# Patient Record
Sex: Male | Born: 1970 | Race: White | Hispanic: No | Marital: Married | Smoking: Never smoker
Health system: Southern US, Community
[De-identification: ages and names within clinical notes are randomized; demographics above are authoritative.]

## PROBLEM LIST (undated history)

## (undated) DIAGNOSIS — G4730 Sleep apnea, unspecified: Secondary | ICD-10-CM

## (undated) DIAGNOSIS — F419 Anxiety disorder, unspecified: Secondary | ICD-10-CM

## (undated) HISTORY — PX: APPENDECTOMY: SHX54

---

## 2008-01-02 ENCOUNTER — Emergency Department: Payer: Self-pay | Admitting: Emergency Medicine

## 2008-01-29 ENCOUNTER — Ambulatory Visit: Payer: Self-pay | Admitting: Unknown Physician Specialty

## 2008-02-10 ENCOUNTER — Ambulatory Visit: Payer: Self-pay | Admitting: Unknown Physician Specialty

## 2013-02-18 ENCOUNTER — Ambulatory Visit: Payer: Self-pay | Admitting: Family Medicine

## 2013-02-18 ENCOUNTER — Inpatient Hospital Stay: Payer: Self-pay | Admitting: Surgery

## 2013-02-18 LAB — URINALYSIS, COMPLETE
Bacteria: NONE SEEN
Bilirubin,UR: NEGATIVE
Glucose,UR: NEGATIVE mg/dL (ref 0–75)
Leukocyte Esterase: NEGATIVE
Nitrite: NEGATIVE
Ph: 5 (ref 4.5–8.0)
Squamous Epithelial: NONE SEEN

## 2013-02-18 LAB — COMPREHENSIVE METABOLIC PANEL
Albumin: 2.9 g/dL — ABNORMAL LOW (ref 3.4–5.0)
Alkaline Phosphatase: 168 U/L — ABNORMAL HIGH (ref 50–136)
Anion Gap: 6 — ABNORMAL LOW (ref 7–16)
Bilirubin,Total: 0.8 mg/dL (ref 0.2–1.0)
Calcium, Total: 8.4 mg/dL — ABNORMAL LOW (ref 8.5–10.1)
Creatinine: 1.09 mg/dL (ref 0.60–1.30)
EGFR (Non-African Amer.): >60
Glucose: 118 mg/dL — ABNORMAL HIGH (ref 65–99)
Osmolality: 265 (ref 275–301)
Potassium: 3.6 mmol/L (ref 3.5–5.1)
SGOT(AST): 53 U/L — ABNORMAL HIGH (ref 15–37)
Sodium: 132 mmol/L — ABNORMAL LOW (ref 136–145)
Total Protein: 7.9 g/dL (ref 6.4–8.2)

## 2013-02-18 LAB — CBC
MCHC: 33.3 g/dL (ref 32.0–36.0)
MCV: 92 fL (ref 80–100)

## 2013-02-19 LAB — CBC WITH DIFFERENTIAL/PLATELET
Basophil #: 0 10*3/uL (ref 0.0–0.1)
Basophil %: 0.1 %
Eosinophil %: 0 %
HCT: 34.7 % — ABNORMAL LOW (ref 40.0–52.0)
Lymphocyte #: 0.8 10*3/uL — ABNORMAL LOW (ref 1.0–3.6)
Lymphocyte %: 4.9 %
MCH: 30.5 pg (ref 26.0–34.0)
MCHC: 32.9 g/dL (ref 32.0–36.0)
Monocyte #: 0.8 x10 3/mm (ref 0.2–1.0)
Monocyte %: 5.1 %
Neutrophil #: 13.8 10*3/uL — ABNORMAL HIGH (ref 1.4–6.5)
Platelet: 300 10*3/uL (ref 150–440)
RBC: 3.74 10*6/uL — ABNORMAL LOW (ref 4.40–5.90)
WBC: 15.4 10*3/uL — ABNORMAL HIGH (ref 3.8–10.6)

## 2013-02-19 LAB — BASIC METABOLIC PANEL
Anion Gap: 4 — ABNORMAL LOW (ref 7–16)
Calcium, Total: 7.9 mg/dL — ABNORMAL LOW (ref 8.5–10.1)
Co2: 29 mmol/L (ref 21–32)
Creatinine: 1.14 mg/dL (ref 0.60–1.30)
EGFR (African American): >60
EGFR (Non-African Amer.): >60
Glucose: 197 mg/dL — ABNORMAL HIGH (ref 65–99)
Osmolality: 272 (ref 275–301)
Sodium: 133 mmol/L — ABNORMAL LOW (ref 136–145)

## 2013-02-20 LAB — CBC WITH DIFFERENTIAL/PLATELET
Basophil #: 0 10*3/uL (ref 0.0–0.1)
Basophil %: 0.3 %
Eosinophil #: 0.3 10*3/uL (ref 0.0–0.7)
HCT: 32.9 % — ABNORMAL LOW (ref 40.0–52.0)
HGB: 11 g/dL — ABNORMAL LOW (ref 13.0–18.0)
Lymphocyte #: 1.3 10*3/uL (ref 1.0–3.6)
Lymphocyte %: 8.3 %
MCV: 93 fL (ref 80–100)
Monocyte %: 7.3 %
Neutrophil #: 13.3 10*3/uL — ABNORMAL HIGH (ref 1.4–6.5)
Neutrophil %: 82.5 %
RBC: 3.56 10*6/uL — ABNORMAL LOW (ref 4.40–5.90)
WBC: 16.1 10*3/uL — ABNORMAL HIGH (ref 3.8–10.6)

## 2013-02-20 LAB — BASIC METABOLIC PANEL
Anion Gap: 4 — ABNORMAL LOW (ref 7–16)
Calcium, Total: 7.9 mg/dL — ABNORMAL LOW (ref 8.5–10.1)
Chloride: 101 mmol/L (ref 98–107)
EGFR (African American): >60
EGFR (Non-African Amer.): >60
Glucose: 136 mg/dL — ABNORMAL HIGH (ref 65–99)
Osmolality: 272 (ref 275–301)
Potassium: 4.5 mmol/L (ref 3.5–5.1)
Sodium: 135 mmol/L — ABNORMAL LOW (ref 136–145)

## 2013-02-21 LAB — BASIC METABOLIC PANEL
Calcium, Total: 8 mg/dL — ABNORMAL LOW (ref 8.5–10.1)
Chloride: 99 mmol/L (ref 98–107)
Creatinine: 0.86 mg/dL (ref 0.60–1.30)
EGFR (Non-African Amer.): >60
Glucose: 136 mg/dL — ABNORMAL HIGH (ref 65–99)
Potassium: 4.3 mmol/L (ref 3.5–5.1)
Sodium: 134 mmol/L — ABNORMAL LOW (ref 136–145)

## 2013-02-21 LAB — CBC WITH DIFFERENTIAL/PLATELET
Basophil #: 0.1 10*3/uL (ref 0.0–0.1)
Eosinophil #: 0.2 10*3/uL (ref 0.0–0.7)
HGB: 10.6 g/dL — ABNORMAL LOW (ref 13.0–18.0)
Lymphocyte %: 6.7 %
MCHC: 32.5 g/dL (ref 32.0–36.0)
Monocyte %: 7 %
RBC: 3.53 10*6/uL — ABNORMAL LOW (ref 4.40–5.90)
WBC: 15.4 10*3/uL — ABNORMAL HIGH (ref 3.8–10.6)

## 2013-02-23 LAB — WOUND CULTURE

## 2013-02-24 LAB — CBC WITH DIFFERENTIAL/PLATELET
Basophil %: 0.4 %
Eosinophil #: 0.4 10*3/uL (ref 0.0–0.7)
Eosinophil %: 2.6 %
HCT: 37.6 % — ABNORMAL LOW (ref 40.0–52.0)
HGB: 12.8 g/dL — ABNORMAL LOW (ref 13.0–18.0)
Lymphocyte #: 1.5 10*3/uL (ref 1.0–3.6)
Lymphocyte %: 9.9 %
MCHC: 34 g/dL (ref 32.0–36.0)
Monocyte #: 0.8 x10 3/mm (ref 0.2–1.0)
Monocyte %: 5.4 %
Neutrophil #: 12 10*3/uL — ABNORMAL HIGH (ref 1.4–6.5)
Neutrophil %: 81.7 %
Platelet: 450 10*3/uL — ABNORMAL HIGH (ref 150–440)
RBC: 4.17 10*6/uL — ABNORMAL LOW (ref 4.40–5.90)
RDW: 14.7 % — ABNORMAL HIGH (ref 11.5–14.5)
WBC: 14.7 10*3/uL — ABNORMAL HIGH (ref 3.8–10.6)

## 2013-02-24 LAB — COMPREHENSIVE METABOLIC PANEL
Albumin: 2.4 g/dL — ABNORMAL LOW (ref 3.4–5.0)
Alkaline Phosphatase: 249 U/L — ABNORMAL HIGH (ref 50–136)
Anion Gap: 5 — ABNORMAL LOW (ref 7–16)
Bilirubin,Total: 0.5 mg/dL (ref 0.2–1.0)
Chloride: 99 mmol/L (ref 98–107)
Co2: 31 mmol/L (ref 21–32)
Glucose: 106 mg/dL — ABNORMAL HIGH (ref 65–99)
SGOT(AST): 107 U/L — ABNORMAL HIGH (ref 15–37)
SGPT (ALT): 135 U/L — ABNORMAL HIGH (ref 12–78)
Sodium: 135 mmol/L — ABNORMAL LOW (ref 136–145)
Total Protein: 7.6 g/dL (ref 6.4–8.2)

## 2013-02-24 LAB — PROTIME-INR: Prothrombin Time: 14.5 secs (ref 11.5–14.7)

## 2013-02-24 LAB — APTT: Activated PTT: 28 secs (ref 23.6–35.9)

## 2013-02-25 LAB — PROTIME-INR
INR: 1.1
Prothrombin Time: 14 secs (ref 11.5–14.7)

## 2013-02-25 LAB — APTT: Activated PTT: 30.3 secs (ref 23.6–35.9)

## 2013-03-01 LAB — CBC WITH DIFFERENTIAL/PLATELET
Basophil %: 0.8 %
Eosinophil #: 0.4 10*3/uL (ref 0.0–0.7)
Eosinophil %: 2.7 %
HCT: 39.4 % — ABNORMAL LOW (ref 40.0–52.0)
HGB: 13 g/dL (ref 13.0–18.0)
Lymphocyte #: 1.8 10*3/uL (ref 1.0–3.6)
Lymphocyte %: 13.4 %
MCH: 29.8 pg (ref 26.0–34.0)
Monocyte #: 0.7 x10 3/mm (ref 0.2–1.0)
Monocyte %: 5.1 %
Neutrophil #: 10.4 10*3/uL — ABNORMAL HIGH (ref 1.4–6.5)
Neutrophil %: 78 %
Platelet: 408 10*3/uL (ref 150–440)
RBC: 4.37 10*6/uL — ABNORMAL LOW (ref 4.40–5.90)
WBC: 13.4 10*3/uL — ABNORMAL HIGH (ref 3.8–10.6)

## 2013-03-02 LAB — CBC WITH DIFFERENTIAL/PLATELET
Basophil %: 0.5 %
Eosinophil #: 0.4 10*3/uL (ref 0.0–0.7)
HCT: 42 % (ref 40.0–52.0)
HGB: 14 g/dL (ref 13.0–18.0)
Lymphocyte %: 15.8 %
MCH: 30.2 pg (ref 26.0–34.0)
Monocyte #: 0.7 x10 3/mm (ref 0.2–1.0)
Monocyte %: 5.5 %
Neutrophil #: 9.6 10*3/uL — ABNORMAL HIGH (ref 1.4–6.5)
Neutrophil %: 75.1 %
RBC: 4.62 10*6/uL (ref 4.40–5.90)

## 2013-03-02 LAB — COMPREHENSIVE METABOLIC PANEL
Alkaline Phosphatase: 157 U/L — ABNORMAL HIGH (ref 50–136)
Anion Gap: 4 — ABNORMAL LOW (ref 7–16)
Calcium, Total: 8.9 mg/dL (ref 8.5–10.1)
Glucose: 133 mg/dL — ABNORMAL HIGH (ref 65–99)
Osmolality: 276 (ref 275–301)
Potassium: 4.2 mmol/L (ref 3.5–5.1)
SGOT(AST): 26 U/L (ref 15–37)
SGPT (ALT): 74 U/L (ref 12–78)

## 2013-03-02 LAB — BODY FLUID CULTURE

## 2013-03-03 LAB — CBC WITH DIFFERENTIAL/PLATELET
Basophil #: 0.1 10*3/uL (ref 0.0–0.1)
Basophil %: 0.5 %
Eosinophil #: 0.5 10*3/uL (ref 0.0–0.7)
Eosinophil %: 3.2 %
HCT: 40.1 % (ref 40.0–52.0)
HGB: 13.3 g/dL (ref 13.0–18.0)
Lymphocyte #: 2.5 10*3/uL (ref 1.0–3.6)
Lymphocyte %: 17.1 %
MCH: 30 pg (ref 26.0–34.0)
MCHC: 33.3 g/dL (ref 32.0–36.0)
MCV: 90 fL (ref 80–100)
Monocyte #: 0.9 x10 3/mm (ref 0.2–1.0)
Neutrophil #: 10.7 10*3/uL — ABNORMAL HIGH (ref 1.4–6.5)
Neutrophil %: 73 %
RBC: 4.44 10*6/uL (ref 4.40–5.90)
RDW: 14.2 % (ref 11.5–14.5)
WBC: 14.7 10*3/uL — ABNORMAL HIGH (ref 3.8–10.6)

## 2013-03-03 LAB — CLOSTRIDIUM DIFFICILE BY PCR

## 2013-03-04 LAB — CBC WITH DIFFERENTIAL/PLATELET
Basophil #: 0.1 10*3/uL (ref 0.0–0.1)
Eosinophil #: 0.5 10*3/uL (ref 0.0–0.7)
Eosinophil %: 4.5 %
HCT: 39.9 % — ABNORMAL LOW (ref 40.0–52.0)
HGB: 13.3 g/dL (ref 13.0–18.0)
MCH: 30.2 pg (ref 26.0–34.0)
MCHC: 33.4 g/dL (ref 32.0–36.0)
MCV: 90 fL (ref 80–100)
Neutrophil #: 8 10*3/uL — ABNORMAL HIGH (ref 1.4–6.5)
Platelet: 363 10*3/uL (ref 150–440)
RBC: 4.41 10*6/uL (ref 4.40–5.90)
RDW: 14.5 % (ref 11.5–14.5)
WBC: 11.5 10*3/uL — ABNORMAL HIGH (ref 3.8–10.6)

## 2013-03-05 LAB — CBC WITH DIFFERENTIAL/PLATELET
Basophil #: 0.2 10*3/uL — ABNORMAL HIGH (ref 0.0–0.1)
Basophil %: 1.8 %
Eosinophil %: 3.7 %
HCT: 41.5 % (ref 40.0–52.0)
Lymphocyte %: 17.5 %
MCHC: 33.1 g/dL (ref 32.0–36.0)
MCV: 91 fL (ref 80–100)
Monocyte %: 5.5 %
Neutrophil #: 9.5 10*3/uL — ABNORMAL HIGH (ref 1.4–6.5)
Neutrophil %: 71.5 %

## 2013-03-05 LAB — PROTIME-INR: INR: 1

## 2013-03-05 LAB — APTT: Activated PTT: 32.3 secs (ref 23.6–35.9)

## 2013-03-05 LAB — CREATININE, SERUM: Creatinine: 1.32 mg/dL — ABNORMAL HIGH (ref 0.60–1.30)

## 2013-03-06 LAB — CBC WITH DIFFERENTIAL/PLATELET
Basophil #: 0.1 10*3/uL (ref 0.0–0.1)
Eosinophil #: 0.5 10*3/uL (ref 0.0–0.7)
Eosinophil %: 4.2 %
Lymphocyte %: 19.4 %
MCH: 30.5 pg (ref 26.0–34.0)
Monocyte #: 0.8 x10 3/mm (ref 0.2–1.0)
Platelet: 335 10*3/uL (ref 150–440)
RDW: 14.5 % (ref 11.5–14.5)

## 2013-03-10 ENCOUNTER — Ambulatory Visit: Payer: Self-pay | Admitting: Surgery

## 2013-03-23 ENCOUNTER — Other Ambulatory Visit: Payer: Self-pay | Admitting: Specialist

## 2013-03-23 LAB — CBC WITH DIFFERENTIAL/PLATELET
Eosinophil #: 0.4 10*3/uL (ref 0.0–0.7)
Eosinophil %: 4.9 %
HCT: 36.3 % — ABNORMAL LOW (ref 40.0–52.0)
HGB: 12.2 g/dL — ABNORMAL LOW (ref 13.0–18.0)
Lymphocyte %: 15.9 %
MCV: 90 fL (ref 80–100)
Monocyte #: 0.7 x10 3/mm (ref 0.2–1.0)
Neutrophil #: 5.9 10*3/uL (ref 1.4–6.5)
Neutrophil %: 70.6 %
Platelet: 205 10*3/uL (ref 150–440)
RBC: 4.05 10*6/uL — ABNORMAL LOW (ref 4.40–5.90)
WBC: 8.4 10*3/uL (ref 3.8–10.6)

## 2013-03-23 LAB — COMPREHENSIVE METABOLIC PANEL
Albumin: 3 g/dL — ABNORMAL LOW (ref 3.4–5.0)
BUN: 13 mg/dL (ref 7–18)
Bilirubin,Total: 0.4 mg/dL (ref 0.2–1.0)
Calcium, Total: 8.6 mg/dL (ref 8.5–10.1)
Chloride: 106 mmol/L (ref 98–107)
Co2: 28 mmol/L (ref 21–32)
Creatinine: 0.87 mg/dL (ref 0.60–1.30)
EGFR (Non-African Amer.): >60
Glucose: 109 mg/dL — ABNORMAL HIGH (ref 65–99)
Potassium: 3.7 mmol/L (ref 3.5–5.1)
SGOT(AST): 26 U/L (ref 15–37)
Total Protein: 6.9 g/dL (ref 6.4–8.2)

## 2013-03-24 ENCOUNTER — Ambulatory Visit: Payer: Self-pay | Admitting: Specialist

## 2013-07-24 ENCOUNTER — Ambulatory Visit: Payer: Self-pay | Admitting: Physician Assistant

## 2013-12-25 IMAGING — CT CT ABD-PELV W/ CM
1 of 2 series · 15 of 32 positions shown, 19 images · non-contrast
Comparison: none

REASON FOR EXAM: (1) pelvic abscess; (2) same
COMMENTS:

[Series 2: soft tissue · axial · 0.84mm/px · z∈[-1100,-586]mm · 15 of 187 slices shown, 19 images]
[im 8/187  soft-tissue]
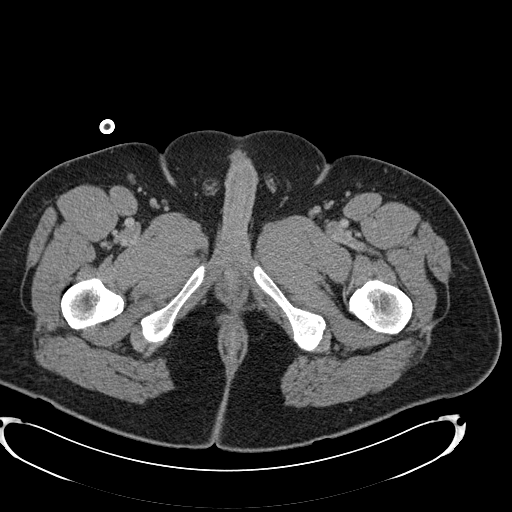
[im 8/187  bone]
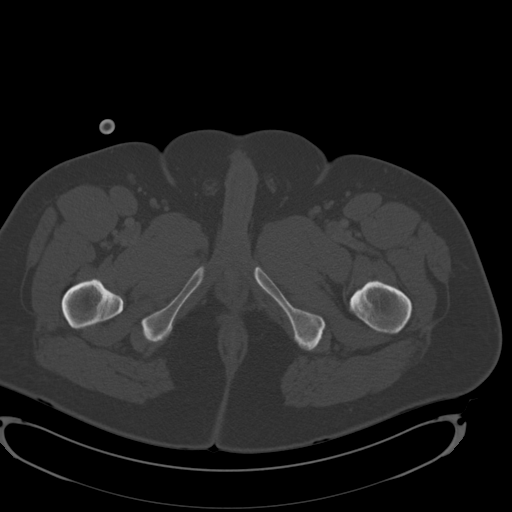
[im 23/187  soft-tissue]
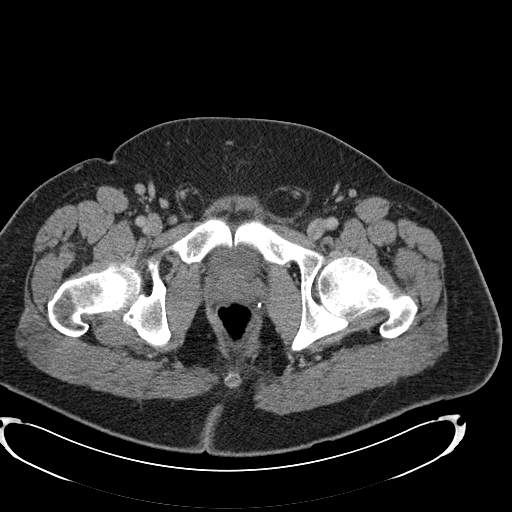
[im 38/187  soft-tissue]
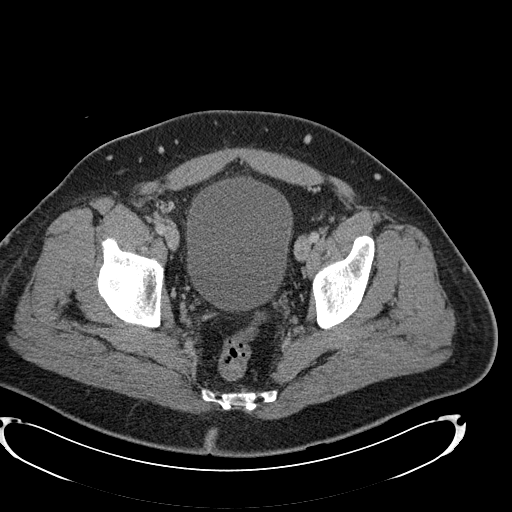
[im 53/187  soft-tissue]
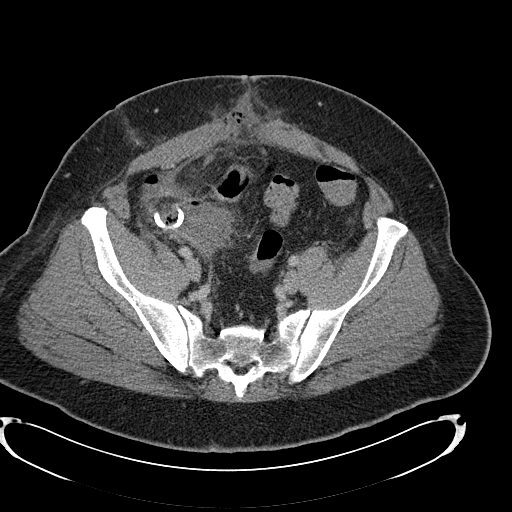
[im 67/187  soft-tissue]
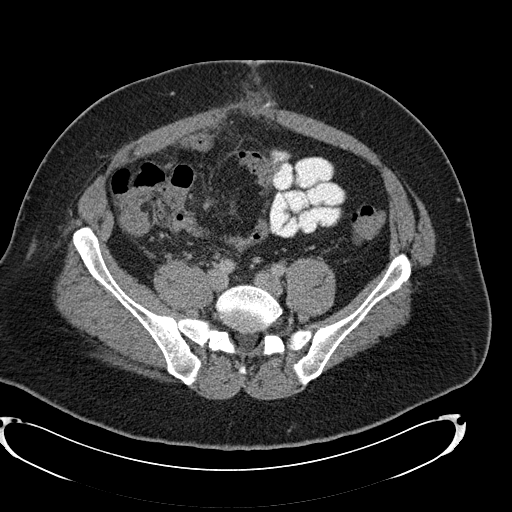
[im 82/187  soft-tissue]
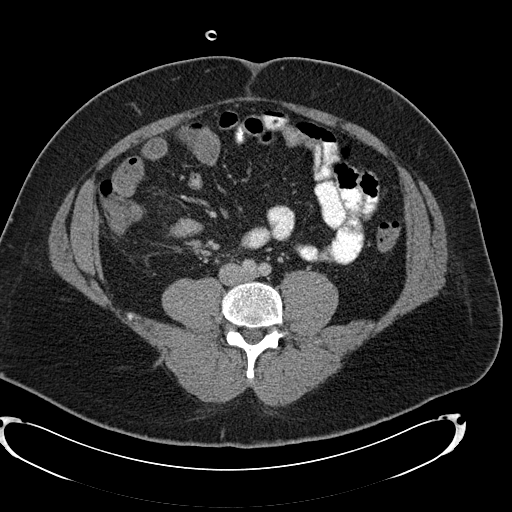
[im 97/187  soft-tissue]
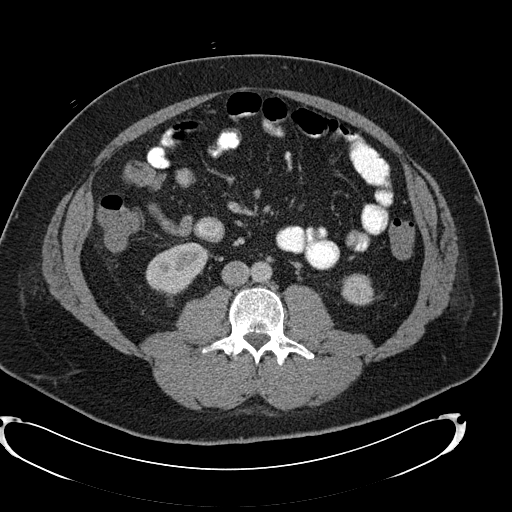
[im 105/187  soft-tissue]
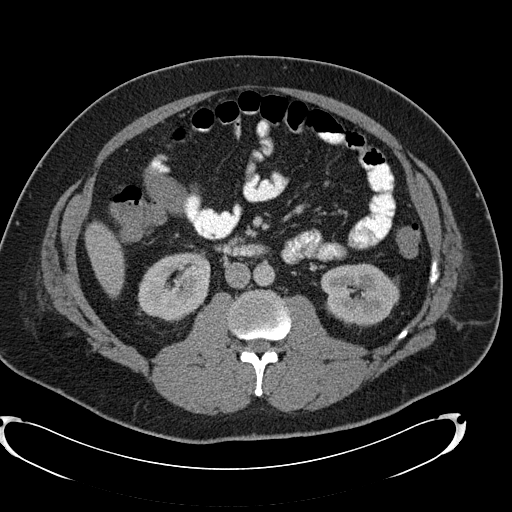
[im 120/187  soft-tissue]
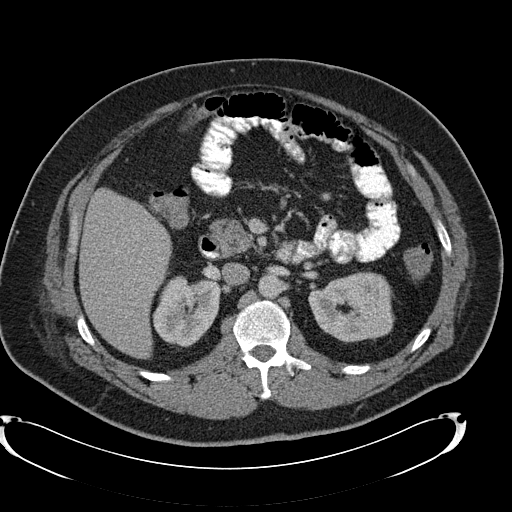
[im 120/187  bone]
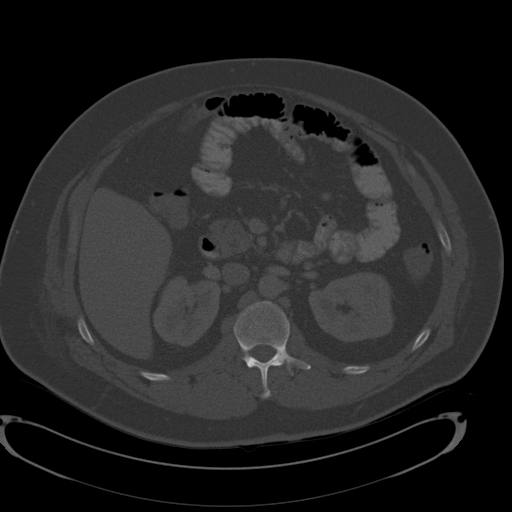
[im 134/187  soft-tissue]
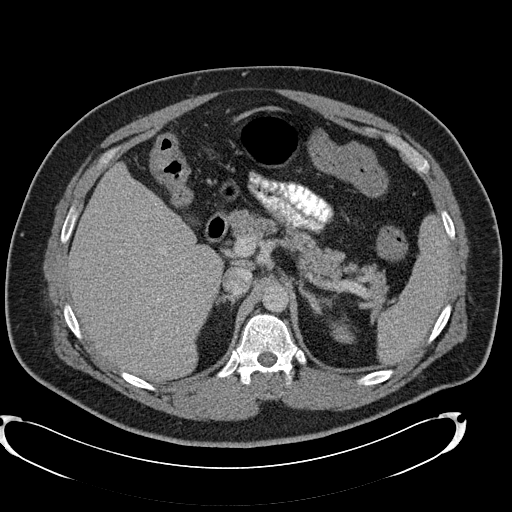
[im 149/187  soft-tissue]
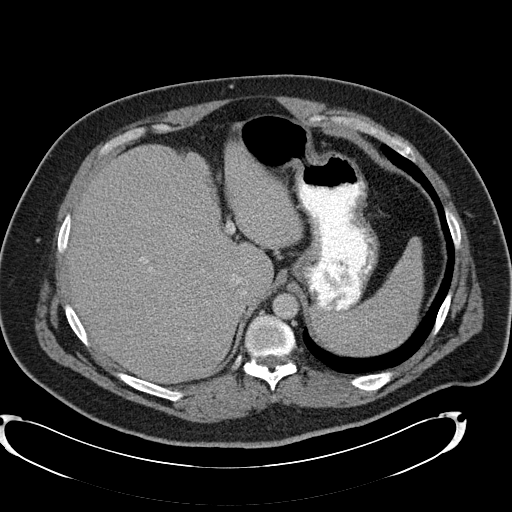
[im 157/187  lung]
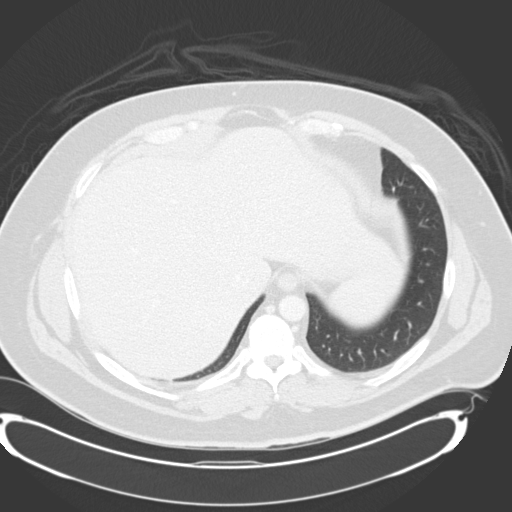
[im 164/187  soft-tissue]
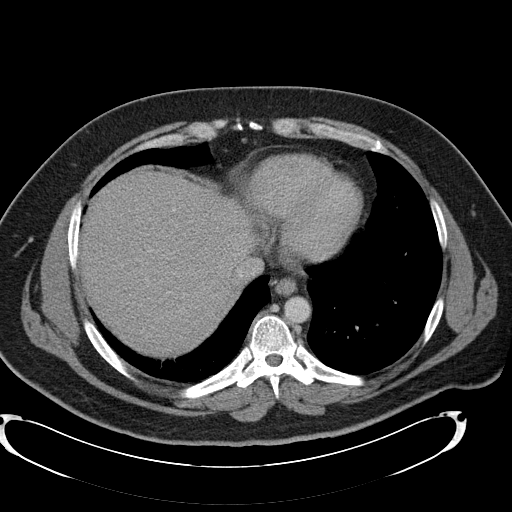
[im 164/187  lung]
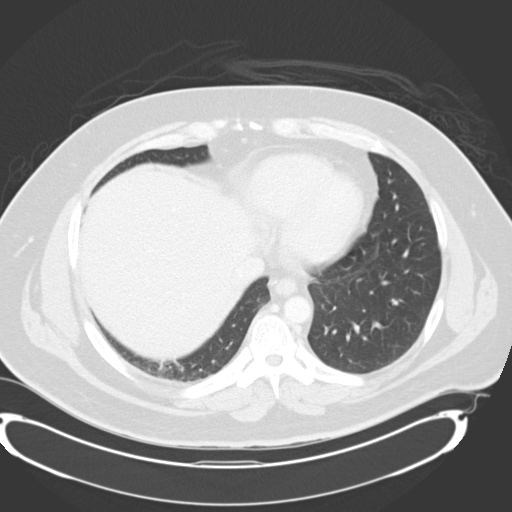
[im 172/187  lung]
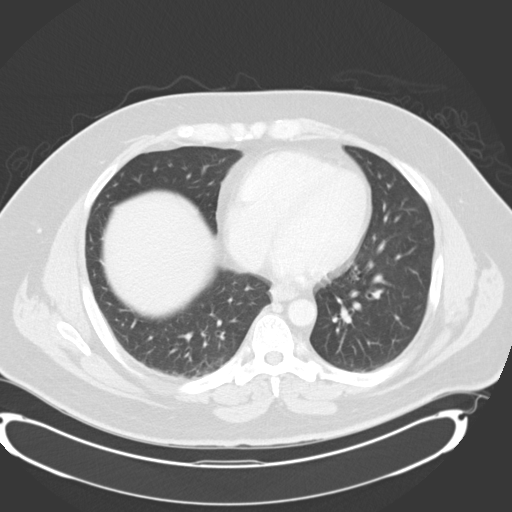
[im 179/187  soft-tissue]
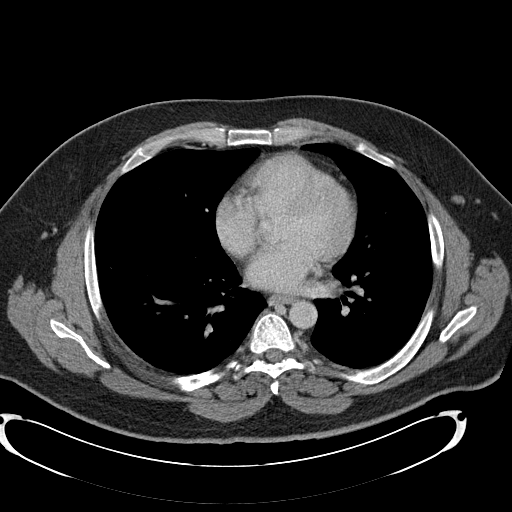
[im 179/187  lung]
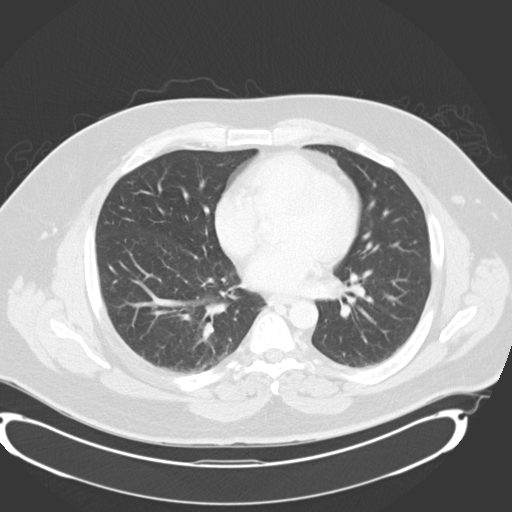

[15 of 32 positions shown; findings below may reference images not displayed]

PROCEDURE:     CT  - CT ABDOMEN / PELVIS  W  - March 02, 2013  [DATE]

RESULT:     CT of the abdomen and pelvis is performed utilizing 100 mL of
Zsovue-2Q0 iodinated intravenous contrast and oral contrast with images
reconstructed at 3 mm slice thickness in the axial plane. Comparison is made
to the previous exam dated 24 February, 2013.

There is right lower quadrant percutaneous drainage catheter present. The
large abscess demonstrated previously in the right lower quadrant is smaller
in size mass showing an anterior-posterior dimension of approximately
cm with a transverse dimension of 4.8 cm. Catheter appears to be along the
right lateral aspect of the abscess. Is the catheter still draining?
Postoperative changes are seen from midline incision in the lower midabdomen
infraumbilical region with some air bubbles within the subcutaneous tissues
but without clear-cut abscess. No ascites is evident. Phlegmonous changes
are seen in the right lower quadrant. The kidneys show no obstruction or
nephrolithiasis. There is no discrete mass evident area no gallstones are
evident. The liver, spleen, pancreas, adrenal glands and abdominal aorta
appear unremarkable. The bowel is otherwise unchanged. There is a moderate
amount of urine in the urinary bladder. Bony structures appear unremarkable.
The lung bases show grossly normal aeration. No pleural or pericardial
effusion is present.
IMPRESSION: 1. Decreasing size of the right lower quadrant abscess with percutaneous
drainage catheter present as described.

[REDACTED]

## 2015-02-18 NOTE — Consult Note (Signed)
Impression:    44yo male w/o sig PMHx admitted with cecal abscess from ruptured appendix, s/p surgical drainage who developed drug reaction.    His drug reaction appears to be restricted to a rash.  He feels that it is improving since changing antibiotics.  Unclear if his reaction was to clinda or to cefoxitan.  Would consider him possibly allergic to either one.  He is tolerating zosyn, which would mean that were he allergic to cephalosporins, he could still tolerate B-lactams.   Zosyn will provide broad abd coverage.  Will continue this for now.   His WBC is still up, but trending down.  Will recheck in am.  His LFTs were up at last check.  Will recheck tomorrow.   Will need to see if surgery feels the need to repeat the CT scan or if they will pull the drains when they no longer have any drainage.  Could eventually change to po.  While there is some increasing E. coli resistance to amp/sulbactam, Augmentin may be a reasonable choice.  Cipro/metronidazole would be another oral option. 6)    Pseudomonas medocina is not a typical human pathogen.  It has been reported in some immunosuppressed patients.  One case report of a healthy male patient with P. medocina sepsis was related to feeding his pet bird water from his mouth in [place].  The water was subsequently found to harbor the organism.  there is no need to specifically targe the P. medocina or the yeast.  Electronic Signatures: Mystique Bjelland MPH, Rosalyn GessMichael E (MD) (Signed on 04-May-14 08:40)  Authored   Last Updated: 04-May-14 08:58 by Elah Avellino MPH, Rosalyn GessMichael E (MD)

## 2015-02-18 NOTE — Consult Note (Signed)
PATIENT NAME:  Jesse Pearson, Jesse Pearson MR#:  [id number] DATE OF BIRTH:  Dec 30, 1970  DATE OF CONSULTATION:  02/18/2013  REFERRING PHYSICIAN:  Rodena Goldmann, III, MD  CONSULTING PHYSICIAN:  Tama High III, MD  REASON FOR CONSULT: Respiratory failure, sepsis in a patient with abdominal abscess, likely ruptured appendiceal abscess.   HISTORY OF PRESENT ILLNESS: The patient is a 44 year old male who presented to acute care earlier this week with abdominal pain, was initially treated with antibiotics but showed progression. He was found to have evidence of tachycardia, fever and worsening abdominal discomfort. CT scan showed evidence of right lower quadrant abscess which was worrisome for a ruptured appendix. He has undergone operative draining of the abscess through surgery. Postoperatively, he has remained on the ventilator, hypotensive, requiring Diprivan for sedation. We were asked to see him for further assessment and assistance in management.   PAST MEDICAL HISTORY: Mild hypertension, per old records.   ALLERGIES: No known drug allergies.   MEDICATIONS: 1.  D5 normal saline +20 mEq of K at 150 mL/hr.  2.  Diprivan drip.  3.  Cefoxitin 2 grams IV q. 8 hours.  4.  Lovenox 40 mg subcutaneous daily.  5.  Dilaudid 1 to 2 mg IV every 2 hours p.r.n. pain.  6.  Pantoprazole 40 mg IV b.i.d.  7.  Zofran 4 mg IV q. 4 hours p.r.n.   SOCIAL HISTORY: Obtained from old records. History of chewing tobacco. No alcohol.   FAMILY HISTORY: Lung cancer.   REVIEW OF SYSTEMS: Please see history of present illness. The patient is unable to provide further information.  All information was gathered from the chart and review of old records.    PHYSICAL EXAMINATION:  VITAL SIGNS: Initial temperature 98.7 with pulse of 103. Current pulse is 81 with blood pressure 101/55.  GENERAL: Obese male, intubated, sedated.  HEENT: Eyes: Pupils are minimally reactive. He blinks to touch of the lids. Ears, nose and throat:  Endotracheal tube is in place. The oropharynx is dry without lesions.  NECK: Trachea in the midline. No thyromegaly.  CARDIOVASCULAR: Regular rate and rhythm without murmurs, gallops or rubs. Carotid and radial pulses are 1+.  LUNGS: Clear bilaterally without the patient breathing over the vent. Current settings are rate of 14, tidal volume 600, FiO2 40%.  ABDOMEN: Soft and distended with absent bowel sounds.  JP drain in the right lower quadrant.  SKIN: Postoperative changes as above without other significant rashes or nodules.  LYMPH NODES: No cervical or supraclavicular nodes.  MUSCULOSKELETAL: No clubbing, cyanosis, edema.  NEUROLOGIC: The patient is sedated as noted above.   LABORATORY AND RADIOLOGICAL DATA: ABG at current vent settings showed pH 7.3 with pCO2 56, pO2 85. Urinalysis quite concentrated at greater than 1.06, otherwise unremarkable. BUN 10, creatinine 1.09, sodium 132. Alkaline phosphatase 168 with ALT 94, AST 53, albumin 2.9, glucose 118. White count 20.7, hematocrit 38.   IMPRESSION AND PLAN:  Abdominal abscess with sepsis, likely secondary to ruptured appendix:  Continue sedation and management of ventilator for now. We will discuss with Surgery regarding further intervention. Antibiotic choices through Surgery are certainly appropriate in this patient who has had minimal contact with health care and should cover the necessary organisms. I agree with aggressive hydration as he appears intravascularly depleted. We will increase the vent rate up to 16 and repeat his ABG. Given his body habitus, he may have some elements of sleep apnea; we will have to watch this closely as he is  weaned off the vent. Continue monitoring CBC, Met-B as you are doing and watch for worsening of renal function secondary to sepsis, which may require alteration of his antibiotic dosing. We will follow with you during this hospitalization.  ____________________________ Adin Hector,  MD bjk:cb D: 02/18/2013 20:08:18 ET T: 02/18/2013 20:26:34 ET JOB#: [id number] cc: Adin Hector, MD, <Dictator> Ramonita Lab MD ELECTRONICALLY SIGNED 02/24/2013 6:47

## 2015-02-18 NOTE — Consult Note (Signed)
Brief Consult Note: Diagnosis: post op abdominal abscess.   Patient was seen by consultant.   Consult note dictated.   Orders entered.  Electronic Signatures: Curtis SitesKlein, Bert J (MD)  (Signed 23-Apr-14 20:02)  Authored: Brief Consult Note   Last Updated: 23-Apr-14 20:02 by Curtis SitesKlein, Bert J (MD)

## 2015-02-18 NOTE — Discharge Summary (Signed)
PATIENT NAME:  Jesse Pearson, Jesse Pearson MR#:  [id number] DATE OF BIRTH:  Mar 12, 1971  DATE OF ADMISSION:  02/18/2013 DATE OF DISCHARGE:  03/07/2013  DIAGNOSES:  Acute appendicitis with rupture and pelvic abscess with recurrent pelvic abscess wound infection.   PROCEDURES:  Exploratory laparotomy and drainage of abscess, CT-guided drainage of recurrent abscess and manipulation of drain for further drainage of pelvic abscess.   CONSULTANTS:  Dr. Graciela [facility]Klein [facility], Dr. Leavy CellaBlocker from infectious diseases and Dr. Belia HemanKasa, pulmonary medicine and ICU.   HISTORY OF PRESENT ILLNESS AND HOSPITAL COURSE:  This is a 44 year old man who [facility] with approximately one week of abdominal pain, nausea and low-grade fevers.  A work-up suggested a ruptured appendix with a pelvic abscess.  This was seen on CT scan.  Dr. Michela PitcherEly took the patient to the operating room on the day of admission and drained a large pelvic abscess.  The appendix could not be identified and was likely autolyzed.  A drain was placed and he was treated with IV antibiotics.    During the immediate postoperative period on IV antibiotics he did well and his drain was removed, however shortly thereafter he started having low-grade fevers and a CT scan revealed a recurrent pelvic abscess.  CT-guided drainage was performed and a follow-up CT scan suggested that only a portion of the abscess cavity was drained, manipulation of the CT-guided catheter was performed to allow for better drainage.    Currently the patient has a catheter in a pelvic cavity.  It is draining purulent material quite well.  His white blood cell count is 11,000 and he has been afebrile.    Dr. Leavy CellaBlocker saw the patient and I spoke to him personally yesterday about discharging the patient on Invanz for the cultures that have been obtained from the catheter and drainages.  We started on Invanz 1 gram q. 24.  A PICC line has been placed  and the patient is instructed to follow up with Dr. Leavy CellaBlocker in approximately 10 days.    The patient also had a wound infection.  That wound was opened and cultured and had gram-negative rods on Gram stain.  No culture positivity was obtained.  He is currently undergoing wet-to-dry dressings q. 8 hours.   The patient is discharged today with a PICC line with home health for routine PICC care with drain care to be performed routinely and for normal saline wet-to-dry dressings 3 times daily to his midline abdominal wound.  He is given Percocet and a prescription for the Invanz.    A CT scan will be scheduled on Tuesday for follow-up to assess complete collapse of the pelvic abscess cavity and if that is the case he will be seen a day or two later and the drain removed.  He will be seeing Dr. Leavy CellaBlocker in approximately 10 days to assess possible switch to oral antibiotics.  This has all been discussed with the patient.  He is understanding of the plan.  He will be given his last dose of Invanz in the hospital this afternoon prior to discharge and then will be discharged for follow-up as above and for home health to see the patient tomorrow.     ____________________________ Adah Salvageichard E. Excell Seltzerooper, MD rec:ea D: 03/07/2013 09:49:09 ET T: 03/08/2013 00:45:55 ET JOB#: [id number]  cc: Adah Salvageichard E. Excell Seltzerooper, MD, <Dictator> Lattie HawICHARD E Julieann Drummonds MD ELECTRONICALLY SIGNED 03/08/2013 12:58

## 2015-02-18 NOTE — Consult Note (Signed)
PATIENT NAME:  Jesse Pearson, Jesse Pearson MR#:  [id number] DATE OF BIRTH:  February 06, 1971  INFECTIOUS DISEASES VISIT  DATE OF CONSULTATION:  03/01/2013  REFERRING PHYSICIAN:  Dr. Leanora Cover.   CONSULTING PHYSICIAN:  Heinz Knuckles. Aashika Carta, MD  REASON FOR CONSULTATION: Drug reaction, with a ruptured appendix with abscess.   HISTORY OF PRESENT ILLNESS: The patient is a 44 year old male without a significant past medical history who was admitted on 04/23 with a several-day history of abdominal discomfort and dysuria. He also was having fevers. He initially presented became ill at work where he began having some suprapubic and right lower quadrant discomfort. He presented to Urgent Care and was thought to have possible nephrolithiasis. He was given antibiotics, but over the next  24 hours he somewhat improved. However later that day he began having significantly worsened abdominal pain. He was re-evaluated, and a CT scan was obtained which demonstrated a right lower quadrant abscess. He was admitted to the hospital and taken to the operating room where he had a laparotomy with drainage of an abscess on 04/23. Cultures from that time grew 2 strains of Escherichia coli and a Bacteroides species. The patient was initially intubated postoperatively in the ICU. He eventually was extubated the following day. A repeat CT scan from 04/29 showed a decrease in the size of the right lower quadrant fluid collection, but still a significant collection was present. He had a percutaneous pigtail drain placed.   He has subsequently been doing better. He has had improvement in his pain. He has some minimal pain at the drainage site. He is not having fevers, chills, or sweats. He is eating well and ambulating without significant problem. He had initially been treated with clindamycin and cefoxitin. Four days ago he began having itching on his back, and 2 days ago he broke out in an erythematous rash that was fairly generalized, mainly on the  torso and arms more than the legs. THE CLINDAMYCIN WAS INITIALLY STOPPED, AND YESTERDAY THE CEFOXITIN WAS STOPPED and changed to Zosyn. The patient states the rash is improved today and he has less itching. He denies any shortness of breath or wheezing related to the rash. He has not had any significant change in his bowels. He has not had any oral ulcers or lesions.   ALLERGIES: Prior to admission, none.   PAST MEDICAL HISTORY: Hypertension, for which he does not take antihypertensives.   SOCIAL HISTORY: He does not drink. He does not smoke. He lives at home. He is currently employed.   FAMILY HISTORY: Positive for lung cancer.   REVIEW OF SYSTEMS:  GENERAL: On admission he was having fevers, chills, and malaise. Currently these symptoms have resolved. He has no focal weakness.  HEENT: No headaches. No sinus congestion. No sore throat.  NECK: No stiffness. No swollen glands.  RESPIRATORY: No cough. No shortness of breath. No sputum production.  CARDIAC: No chest pains or palpitations. No peripheral edema.  GI: No nausea, no vomiting. Minimal abdominal pain. This is markedly improved since his surgery, and a drain is being placed. No change in his bowels.  GENITOURINARY: He initially had dysuria on presentation, but no hematuria. No increased frequency. These symptoms have resolved.  MUSCULOSKELETAL: No complaints.  SKIN: He has developed a rash over the last several days that has been erythematous, fairly generalized, but more on the upper part of the body than the lower. There is associated pruritus. This has improved over the last day or so. He has not had any  oral lesions that he has noted.  NEUROLOGIC: No focal weakness.  PSYCHIATRIC: No complaints.   All other systems are negative.   PHYSICAL EXAMINATION: VITAL SIGNS: T-max of 100.8, T-current of 97.9, pulse 84, blood pressure 134/82, 98% on room air.  GENERAL: A 44 year old white male in no acute distress.  HEENT: Normocephalic,  atraumatic. Pupils equal and reactive to light. Extraocular motions intact. Sclerae, conjunctivae, and lids without evidence for emboli or petechiae. Oropharynx shows no erythema or exudate. Teeth and gums are in good condition.  NECK: Supple. Full range of motion. Midline trachea. No lymphadenopathy. No thyromegaly.  CHEST: Clear to auscultation bilaterally, with good air movement. No focal consolidation.  HEART: Regular rate and rhythm without murmur, rub, or gallop.  ABDOMEN: Soft. Minimally tender in the right lower quadrant. There is no rebound or guarding. There is no hepatosplenomegaly. There is no hernia noted. He did have a drain present in the right lower quadrant.   EXTREMITIES: No evidence for tenosynovitis.  SKIN: He had an erythematous, blanching rash with some coalescence of the macular lesions. There were no ulcerations. There were no purulent lesions or vesicles. There were no oral lesions noted. The rash was on the upper extremities, chest, abdomen, back, neck. He had erythema of the face and head and scalp, but did not appear to have discrete lesions. There were scattered lesions on the lower extremities as well. The last appeared to spare the palms. He had no stigmata of endocarditis, specifically, no Janeway lesions or Osler nodes.  NEUROLOGIC: The patient was awake and interactive, moving all 4 extremities.  PSYCHIATRIC: Mood and affect appeared normal.   LABORATORY DATA: BUN of 15, creatinine 1.14, bicarbonate of 31, anion gap of 5, AST 107, ALT 135, alk phos 249, total bilirubin of 0.5.   His LFTs on admission showed an AST of 53, ALT of 94, alkaline phosphatase of 168, total bilirubin of 0.8.   White count today was 13.4, with a hemoglobin of 13.0, platelet count of 408, ANC of 10.4. His white count on admission was 20.7 and has steadily has been coming down.   Wound culture from admission is growing two strains of E. coli and bacteroides. Culture from the fluid related to  that drain is growing Pseudomonas mendocina and some yeast.   A UA from admission was unremarkable.   A CT scan of the abdomen and pelvis with contrast from admission shows a 10.5 x 6 cm complex fluid collection in the right lower quadrant, with an air-fluid level concerning for abscess.   A chest x-ray from admission showed hypoinflation, with a right upper lobe atelectasis and some evidence for pulmonary vascular congestion. Chest x-ray from the following day showed improvement in the aeration of the lung. Still is some possible CHF present.   A CT scan of the abdomen and pelvis with contrast from 04/29 showed decrease in the size of the right lower quadrant fluid collection.   A CT-guided drain placement on 04/20 aspirated 25 mL of blood from the fluid collection.   IMPRESSION: A 44 year old male without a significant past medical history who was admitted with a cecal abscess from a ruptured appendix, status post surgical drainage, who DEVELOPED A DRUG REACTION.   RECOMMENDATIONS: 1.  His drug reaction appears to be restricted to a rash. He feels it is  improving since changing antibiotics. IT IS UNCLEAR IF HIS REACTION WAS DUE TO CLINDAMYCIN OR CEFOXITIN. WOULD CONSIDER HIM POSSIBLY ALLERGIC TO EITHER ONE. He is tolerating  Zosyn, which could mean that were he allergic to the cephalosporin he would still be able to tolerate beta lactams.  2.  Zosyn will provide broad antibiotic coverage. Will continue this for now.  3.  His white count is still up, but trending down. We will recheck tomorrow. His LFTs were up at last check. Will recheck tomorrow.  4.  I will need to see if surgery feels the need to repeat the CT scan or if they will pull the drain when they no longer have significant drainage.  5.  He could eventually be changed to p.o. While is some increasing Escherichia coli resistance to amp/sulbactam, Augmentin may be a reasonable choice. Cipro and metronidazole would be another option.   6. Pseudomonas mendocina is not typical human pathogen. It is an environmental pseudomonad. It has been reported in some immunosuppressed patients. One case report of a healthy male patient with P. mendocina sepsis was related to feeding his pet bird water from his mouth in [place]. The water was subsequently found to be harboring the organism. There is no need to treat the Pseudomonas mendocina or the yeast found on the second culture specifically.   This is a moderately complex infectious disease case. Thank you very much for involving me in this patient's cares.   ____________________________ Heinz Knuckles Aizah Gehlhausen, MD meb:dm D: 03/01/2013 08:57:40 ET T: 03/01/2013 12:01:51 ET JOB#: [id number]  cc: Heinz Knuckles. Smith Potenza, MD, <Dictator> Shiva Karis E Sabena Winner MD ELECTRONICALLY SIGNED 03/02/2013 10:08

## 2015-02-18 NOTE — H&P (Signed)
PATIENT NAME:  Jesse Pearson, Jesse Pearson MR#:  [id number] DATE OF BIRTH:  Mar 23, 1971  DATE OF ADMISSION:  02/18/2013  PRIMARY CARE PHYSICIAN: John B. Danne HarborWalker, III, MD  ADMITTING PHYSICIAN:  Quentin Orealph L. Ely, III, MD   CHIEF COMPLAINT: Abdominal pain and nausea.   BRIEF HISTORY: The patient is a 44 year old gentleman seen in the Emergency Room on referral [facility]. He had had a week's symptoms of abdominal pain, primarily lower midline and right lower quadrant pain. The pain came on suddenly and progressed over several days. He was seen in the acute care unit the first of this week with abdominal pain, felt to have possible diverticulitis and started on antibiotics. His symptoms worsened, including anorexia, nausea and marked abdominal discomfort. He developed low-grade fever, was flushed and felt very poorly. He presented back to the Acute Care Unit this morning where CT scan was recommended. CT scan demonstrated a large pelvic abscess suspicious for a possible ruptured appendicitis, and the patient was referred to the Emergency Room for further evaluation and intervention.   The patient denies any previous similar problems. He denies history of hepatitis, yellow jaundice, pancreatitis, peptic ulcer disease, gallbladder disease or diverticulitis. He has had no previous abdominal surgery. He denies history of cardiac disease, hypertension, diabetes or thyroid disease. He was evaluated a number of years ago with multiple CTs and endoscopies for weight loss, but no significant etiology for that problem was identified.   MEDICATIONS: He takes no medication regularly.   ALLERGIES: Has no medical allergies.   SOCIAL HISTORY: He is not a cigarette smoker and does not drink any alcohol.   FAMILY HISTORY: Noncontributory.   REVIEW OF SYSTEMS: Otherwise unremarkable.    PHYSICAL EXAMINATION: GENERAL: He is an alert, pleasant gentleman in moderate distress from pain. He is flushed,  febrile to 38.2, blood pressure is 110/78.  Heart rate is 105 and regular.  HEENT: He is flushed with no scleral icterus, no facial deformities, no pupillary abnormalities.  NECK: Supple with no tenderness or adenopathy. Trachea is midline.  CHEST: Clear, no adventitious sounds.  CARDIAC: No murmurs or gallops.  He seems to be in normal sinus rhythm.  ABDOMEN: Soft, but he has marked right lower quadrant suprapubic tenderness with mild rebound and significant guarding. He has active bowel sounds. No hernias are noted.  EXTREMITIES: Lower extremity exam reveals good range of motion, no deformities and good distal pulses.   IMPRESSION AND PLAN: I have independently reviewed his CT scan and reviewed the CT scan with the radiologist on the interventional radiology team. He has a large softball-size abscess in the right lower quadrant, the most likely source being appendicitis with rupture. The appendix is not easily visualized on the scan. The interventional radiology team does not feel that this area can  be approached percutaneously because of the lack of access through open areas. For that reason, we have discussed the options for surgical intervention with the patient. I will plan a mini-laparotomy and drainage of his abscess with placement of a drain but no attempt to look at his appendix or remove his appendix. This plan has been discussed with the patient in detail, and he is in agreement.   ____________________________ Carmie Endalph L. Ely III, MD rle:cb D: 02/18/2013 17:43:12 ET T: 02/18/2013 18:00:57 ET JOB#: [id number]  cc: Carmie Endalph L. Ely III, MD, <Dictator> John B. Danne HarborWalker III, MD Quentin OreALPH L ELY MD ELECTRONICALLY SIGNED 02/20/2013 10:45

## 2015-02-18 NOTE — Op Note (Signed)
PATIENT NAME:  Jesse Pearson, Jesse Pearson MR#:  [id number] DATE OF BIRTH:  [birth year]  DATE OF PROCEDURE:  03/06/2013  PREOPERATIVE DIAGNOSIS: Ruptured appendicitis with abscess and need for extended intravenous antibiotics.   POSTOPERATIVE DIAGNOSIS: Ruptured appendicitis with abscess and need for extended intravenous antibiotics.   PROCEDURES:  1.  Ultrasound guidance for vascular access to right basilic vein.  2.  Fluoroscopic guidance for placement of catheter.  3.  Insertion of peripherally inserted central venous catheter, right arm.  SURGEON: Annice NeedyJason S. Bobette Leyh, M.D.   ANESTHESIA: Local.   ESTIMATED BLOOD LOSS: Minimal.   INDICATION FOR PROCEDURE: A 44 year old white male with ruptured appendicitis with abscess drainage. He needs extended IV antibiotics. We are asked to place a PICC line.   DESCRIPTION OF PROCEDURE: The patient's right arm was sterilely prepped and draped, and a sterile surgical field was created. The right basilic vein was accessed under direct ultrasound guidance without difficulty with a micropuncture needle and permanent image was recorded. 0.018 wire was then placed into the superior vena cava. Peel-away sheath was placed over the wire. A single lumen peripherally inserted central venous catheter was then placed over the wire and the wire and peel-away sheath were removed. The catheter tip was placed into the superior vena cava and was secured at the skin at 40 cm with a sterile dressing. The catheter withdrew blood well and flushed easily with heparinized saline. The patient tolerated procedure well.  ____________________________ Annice NeedyJason S. Shavonne Ambroise, MD jsd:jm D: 03/09/2013 09:48:57 ET T: 03/09/2013 13:33:58 ET JOB#: [id number]  cc: Annice NeedyJason S. Aris Even, MD, <Dictator> Annice NeedyJASON S Shrihaan Porzio MD ELECTRONICALLY SIGNED 03/16/2013 13:53

## 2015-02-18 NOTE — Op Note (Signed)
PATIENT NAME:  Jesse Pearson, Jesse Pearson MR#:  [id number] DATE OF BIRTH:  07-Jun-1971  DATE OF PROCEDURE:  02/18/2013  PREOPERATIVE DIAGNOSIS:  Pelvic abscess.   POSTOPERATIVE DIAGNOSIS:  Pelvic abscess with ruptured appendicitis.   SURGERY:  Laparotomy and drainage of abscess.   SURGEON:  Quentin Orealph L. Ely, M.D.   ASSISTANT:  Aline AugustHolmes and Igo[employer]uler, [place] students.   ANESTHESIA:  General.   OPERATIVE PROCEDURE:  With the patient in supine position after appropriate general anesthesia, the patient's abdomen was prepped with ChloraPrep and draped with sterile towels.  An alcohol wipe Betadine impregnated Steri-Drape were utilized.  A lower midline incision was made from the umbilicus down to the suprapubic area and carried down through the subcutaneous tissue with Bovie electrocautery.  Midline fascia identified and opened the length of the skin incision as was the peritoneum.  Very gentle manipulation right lower quadrant identified a large abscess that appeared to have multiple loops of bowel draped over it.  Using finger fracture technique over the bladder, a foul-smelling pus was encountered.  Approximately 250 mL of purulence was removed.  The area was copiously irrigated.  A round Jackson-Pratt drain was inserted through a separate stab wound in the right lower quadrant and directing to the pelvic abscess.  No attempt was made to identify the appendix or the source of the problem in the right lower quadrant.  The drain was secured with 3-0 nylon.  The midline fascia was closed with a running suture of #1 looped PDS suture and the skin was closed loosely with clips.  Sterile dressings were applied.  The patient was returned to the recovery room having tolerated the procedure well.  Sponge, instrument and needle counts were correct x 2 in the operating room.     ____________________________ Carmie Endalph L. Ely III, MD rle:ea D: 02/18/2013 17:45:11 ET T: 02/18/2013 18:02:45 ET JOB#: [id number]  cc: Quentin Orealph L. Ely III, MD,  <Dictator> Quentin OreALPH L ELY MD ELECTRONICALLY SIGNED 02/20/2013 10:45

## 2015-04-11 ENCOUNTER — Inpatient Hospital Stay
Admission: EM | Admit: 2015-04-11 | Discharge: 2015-04-22 | DRG: 885 | Disposition: A | Discharge status: Home / Self Care | Payer: PRIVATE HEALTH INSURANCE | Source: Intra-hospital | Attending: Psychiatry | Admitting: Psychiatry

## 2015-04-11 ENCOUNTER — Emergency Department
Admission: EM | Admit: 2015-04-11 | Discharge: 2015-04-11 | Disposition: A | Discharge status: Home / Self Care | Payer: PRIVATE HEALTH INSURANCE | Attending: Emergency Medicine | Admitting: Emergency Medicine

## 2015-04-11 ENCOUNTER — Encounter: Payer: Self-pay | Admitting: Emergency Medicine

## 2015-04-11 DIAGNOSIS — R45851 Suicidal ideations: Secondary | ICD-10-CM | POA: Diagnosis present

## 2015-04-11 DIAGNOSIS — R440 Auditory hallucinations: Secondary | ICD-10-CM | POA: Diagnosis present

## 2015-04-11 DIAGNOSIS — G4730 Sleep apnea, unspecified: Secondary | ICD-10-CM

## 2015-04-11 DIAGNOSIS — Z9049 Acquired absence of other specified parts of digestive tract: Secondary | ICD-10-CM | POA: Diagnosis present

## 2015-04-11 DIAGNOSIS — R918 Other nonspecific abnormal finding of lung field: Secondary | ICD-10-CM | POA: Diagnosis present

## 2015-04-11 DIAGNOSIS — Z888 Allergy status to other drugs, medicaments and biological substances status: Secondary | ICD-10-CM | POA: Diagnosis not present

## 2015-04-11 DIAGNOSIS — K219 Gastro-esophageal reflux disease without esophagitis: Secondary | ICD-10-CM | POA: Diagnosis present

## 2015-04-11 DIAGNOSIS — Z638 Other specified problems related to primary support group: Secondary | ICD-10-CM | POA: Diagnosis not present

## 2015-04-11 DIAGNOSIS — G4700 Insomnia, unspecified: Secondary | ICD-10-CM | POA: Diagnosis present

## 2015-04-11 DIAGNOSIS — Z801 Family history of malignant neoplasm of trachea, bronchus and lung: Secondary | ICD-10-CM

## 2015-04-11 DIAGNOSIS — R05 Cough: Secondary | ICD-10-CM

## 2015-04-11 DIAGNOSIS — F329 Major depressive disorder, single episode, unspecified: Secondary | ICD-10-CM | POA: Diagnosis present

## 2015-04-11 DIAGNOSIS — Z599 Problem related to housing and economic circumstances, unspecified: Secondary | ICD-10-CM | POA: Diagnosis not present

## 2015-04-11 DIAGNOSIS — R059 Cough, unspecified: Secondary | ICD-10-CM

## 2015-04-11 DIAGNOSIS — F323 Major depressive disorder, single episode, severe with psychotic features: Principal | ICD-10-CM | POA: Diagnosis present

## 2015-04-11 DIAGNOSIS — J45909 Unspecified asthma, uncomplicated: Secondary | ICD-10-CM | POA: Diagnosis present

## 2015-04-11 DIAGNOSIS — G4733 Obstructive sleep apnea (adult) (pediatric): Secondary | ICD-10-CM | POA: Diagnosis present

## 2015-04-11 DIAGNOSIS — R058 Other specified cough: Secondary | ICD-10-CM

## 2015-04-11 HISTORY — DX: Sleep apnea, unspecified: G47.30

## 2015-04-11 HISTORY — DX: Anxiety disorder, unspecified: F41.9

## 2015-04-11 LAB — COMPREHENSIVE METABOLIC PANEL
ALK PHOS: 79 U/L (ref 38–126)
ALT: 33 U/L (ref 17–63)
AST: 31 U/L (ref 15–41)
Albumin: 4.2 g/dL (ref 3.5–5.0)
Anion gap: 8 (ref 5–15)
BILIRUBIN TOTAL: 0.7 mg/dL (ref 0.3–1.2)
BUN: 13 mg/dL (ref 6–20)
CHLORIDE: 105 mmol/L (ref 101–111)
CO2: 27 mmol/L (ref 22–32)
Calcium: 9.4 mg/dL (ref 8.9–10.3)
Creatinine, Ser: 1.15 mg/dL (ref 0.61–1.24)
GFR calc Af Amer: >60 mL/min (ref >60)
GFR calc non Af Amer: >60 mL/min (ref >60)
GLUCOSE: 161 mg/dL — AB (ref 65–99)
Potassium: 3.6 mmol/L (ref 3.5–5.1)
Sodium: 140 mmol/L (ref 135–145)
Total Protein: 7.9 g/dL (ref 6.5–8.1)

## 2015-04-11 LAB — CBC WITH DIFFERENTIAL/PLATELET
BASOS PCT: 1 %
Basophils Absolute: 0.1 10*3/uL (ref 0–0.1)
EOS PCT: 1 %
Eosinophils Absolute: 0.1 10*3/uL (ref 0–0.7)
HEMATOCRIT: 53 % — AB (ref 40.0–52.0)
HEMOGLOBIN: 17.3 g/dL (ref 13.0–18.0)
Lymphocytes Relative: 16 %
Lymphs Abs: 1.6 10*3/uL (ref 1.0–3.6)
MCH: 31 pg (ref 26.0–34.0)
MCHC: 32.7 g/dL (ref 32.0–36.0)
MCV: 94.7 fL (ref 80.0–100.0)
MONO ABS: 0.7 10*3/uL (ref 0.2–1.0)
Monocytes Relative: 7 %
NEUTROS ABS: 7.6 10*3/uL — AB (ref 1.4–6.5)
Neutrophils Relative %: 75 %
Platelets: 203 10*3/uL (ref 150–440)
RBC: 5.6 MIL/uL (ref 4.40–5.90)
RDW: 14 % (ref 11.5–14.5)
WBC: 10.1 10*3/uL (ref 3.8–10.6)

## 2015-04-11 LAB — URINE DRUG SCREEN, QUALITATIVE (ARMC ONLY)
Amphetamines, Ur Screen: NOT DETECTED
BARBITURATES, UR SCREEN: NOT DETECTED
Benzodiazepine, Ur Scrn: NOT DETECTED
Cannabinoid 50 Ng, Ur ~~LOC~~: NOT DETECTED
Cocaine Metabolite,Ur ~~LOC~~: NOT DETECTED
MDMA (ECSTASY) UR SCREEN: NOT DETECTED
Methadone Scn, Ur: NOT DETECTED
Opiate, Ur Screen: NOT DETECTED
Phencyclidine (PCP) Ur S: NOT DETECTED
TRICYCLIC, UR SCREEN: NOT DETECTED

## 2015-04-11 LAB — URINALYSIS COMPLETE WITH MICROSCOPIC (ARMC ONLY)
BACTERIA UA: NONE SEEN
BILIRUBIN URINE: NEGATIVE
GLUCOSE, UA: NEGATIVE mg/dL
Hgb urine dipstick: NEGATIVE
KETONES UR: NEGATIVE mg/dL
Leukocytes, UA: NEGATIVE
Nitrite: NEGATIVE
Protein, ur: NEGATIVE mg/dL
SPECIFIC GRAVITY, URINE: 1.027 (ref 1.005–1.030)
SQUAMOUS EPITHELIAL / LPF: NONE SEEN
pH: 5 (ref 5.0–8.0)

## 2015-04-11 LAB — ETHANOL: Alcohol, Ethyl (B): <5 mg/dL (ref <5)

## 2015-04-11 MED ORDER — ACETAMINOPHEN 325 MG PO TABS
650.0000 mg | ORAL_TABLET | Freq: Once | ORAL | Status: AC
  Administered 2015-04-11: 650 mg via ORAL

## 2015-04-11 MED ORDER — ACETAMINOPHEN 325 MG PO TABS: 650.0000 mg | ORAL_TABLET | Freq: Four times a day (QID) | ORAL | Status: DC | PRN

## 2015-04-11 MED ORDER — ACETAMINOPHEN 325 MG PO TABS
ORAL_TABLET | ORAL | Status: AC
  Filled 2015-04-11: qty 2

## 2015-04-11 MED ORDER — DOXYCYCLINE HYCLATE 100 MG PO TABS
100.0000 mg | ORAL_TABLET | Freq: Two times a day (BID) | ORAL | Status: DC
  Administered 2015-04-11: 100 mg via ORAL

## 2015-04-11 MED ORDER — ALUM & MAG HYDROXIDE-SIMETH 200-200-20 MG/5ML PO SUSP
30.0000 mL | ORAL | Status: DC | PRN
Start: 2015-04-11 — End: 2015-04-22

## 2015-04-11 MED ORDER — DOXYCYCLINE HYCLATE 100 MG PO TABS
ORAL_TABLET | ORAL | Status: DC
Start: 2015-04-11 — End: 2015-04-11
  Filled 2015-04-11: qty 1

## 2015-04-11 MED ORDER — FLUOXETINE HCL 20 MG PO CAPS
20.0000 mg | ORAL_CAPSULE | Freq: Every day | ORAL | Status: DC
  Administered 2015-04-12 – 2015-04-22 (×11): 20 mg via ORAL
  Filled 2015-04-11 (×11): qty 1

## 2015-04-11 MED ORDER — MAGNESIUM HYDROXIDE 400 MG/5ML PO SUSP: 30.0000 mL | Freq: Every day | ORAL | Status: DC | PRN

## 2015-04-11 NOTE — ED Notes (Signed)

## 2015-04-11 NOTE — ED Notes (Signed)
Pt pulled small live tick off of lower abd area, [place]  MD made aware, started on doxycycline.

## 2015-04-11 NOTE — ED Notes (Signed)

## 2015-04-11 NOTE — Progress Notes (Addendum)
   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 We conversated about his 15 year old son who has 2 more highschool years. He said that he's a [employer] Chaplain allowed the patient to get some rest.  The chaplains and pastoral care can be reached [phone] by submitting an order online or via pager [phone].

## 2015-04-11 NOTE — ED Notes (Signed)
Pt eating snack tray  

## 2015-04-11 NOTE — ED Notes (Addendum)
BEHAVIORAL HEALTH ROUNDING Patient sleeping: yes Patient alert and oriented: yes Behavior appropriate: yes  Nutrition and fluids offered: yes, pt given meal tray Toileting and hygiene offered: yes Sitter present: yes Law enforcement present:: yes  ENVIRONMENTAL ASSESSMENT Potentially harmful objects out of patient reach: yes Personal belongings secured: yes Patient dressed in hospital provided attire only:  yes Plastic bags out of patient reach: yes Patient care equipment (cords, cables, call bells, lines, and drains) shortened, removed, or accounted for: yes Equipment and supplies removed from bottom of stretcher: yes Potentially toxic materials out of patient reach: yes Sharps container removed or out of patient reach:
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
## 2015-04-11 NOTE — ED Notes (Signed)

## 2015-04-11 NOTE — Consult Note (Signed)
[facility] Consult   Reason for Consult:  Consult for this 44 year old man who presents with severe worsening depression Referring Physician:  mclaurin Patient Identification: Jesse Pearson MRN:  [id number] Principal Diagnosis: Severe major depression with psychotic features, mood-congruent Diagnosis:   Patient Active Problem List   Diagnosis Date Noted  . 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 Suicidal ideation [R45.851] 04/11/2015  . Sleep apnea [G47.30] 04/11/2015    Total Time spent with patient: 1 hour  Subjective:   Jesse Pearson is a 44 y.o. male patient admitted with patient brought in and currently under involuntary commitment "I've just been feeling depressed" see notes below.  HPI:  Patient was brought to the hospital today by his sister who insisted that he come to the hospital when she learned that he was having suicidal ideation. He reports that he's been depressed for several months but things have been getting worse for the last week or 2. His wife abruptly told him she wanted a divorce in April and since then he's been feeling worse and worse. His sleep at night is very poor. His energy level is poor. Activities are minimal. He's been continuing to go to his job but says that he can barely get through the day. Doesn't do anything outside of work. As been having suicidal thoughts with ideas of shooting himself. He is not currently getting any psychiatric treatment. Fortunately he denies that he is drinking or abusing any drugs. He admits that he is having auditory hallucinations which she has a hard time describing in which happened intermittently. No visual hallucinations.  Past psychiatric history: Reports that he's never had a period of depression like this before. No previous psychiatric hospitalization. No history of psychiatric medicine or suicide attempts.  Social history: Had been living with his  30 year old son for the last several months. Son went to live with his mother today because the patient knows that he needs to be in the hospital. 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 His closest relative is his sister who insisted that he come to the hospital.  Medical history: Patient is overweight and has sleep apnea. No other known medical problems that he is reporting.  Substance abuse history: Denies that he abuses alcohol or drugs or is any past history of abusing alcohol or drugs.  Family history: No known family history of mental illness or substance abuse  Current medications none that he reports HPI Elements:   Quality:  Depression with suicidal thought and plan. Severity:  Severe. Timing:  Been going on for a couple months but getting worse recently. Duration:  Ongoing as I said for a couple months at least. Context:  His wife has recently left him. Isolated at home..  Past Medical History: History reviewed. No pertinent past medical history.  Past Surgical History  Procedure Laterality Date  . Appendectomy     Family History: History reviewed. No pertinent family history. Social History:  History  Alcohol Use No     History  Drug Use Not on file    History   Social History  . Marital Status: Married    Spouse Name: N/A  . Number of Children: N/A  . Years of Education: N/A   Social History Main Topics  . Smoking status: Never Smoker   . Smokeless tobacco: Not on file  . Alcohol Use: No  .  Drug Use: Not on file  . Sexual Activity: Not on file   Other Topics Concern  . None   Social History Narrative  . None   Additional Social History:                          Allergies:   Allergies  Allergen Reactions  . Cephalosporins Other (See Comments)    Reaction:  Unknown   . Clindamycin/Lincomycin Other (See Comments)    Reaction:  Unknown     Labs:  Results  for orders placed or performed during the hospital encounter of 04/11/15 (from the past 48 hour(s))  CBC WITH DIFFERENTIAL     Status: Abnormal   Collection Time: 04/11/15  3:58 PM  Result Value Ref Range   WBC 10.1 3.8 - 10.6 K/uL   RBC 5.60 4.40 - 5.90 MIL/uL   Hemoglobin 17.3 13.0 - 18.0 g/dL   HCT 53.0 (H) 40.0 - 52.0 %   MCV 94.7 80.0 - 100.0 fL   MCH 31.0 26.0 - 34.0 pg   MCHC 32.7 32.0 - 36.0 g/dL   RDW 14.0 11.5 - 14.5 %   Platelets 203 150 - 440 K/uL   Neutrophils Relative % 75 %   Neutro Abs 7.6 (H) 1.4 - 6.5 K/uL   Lymphocytes Relative 16 %   Lymphs Abs 1.6 1.0 - 3.6 K/uL   Monocytes Relative 7 %   Monocytes Absolute 0.7 0.2 - 1.0 K/uL   Eosinophils Relative 1 %   Eosinophils Absolute 0.1 0 - 0.7 K/uL   Basophils Relative 1 %   Basophils Absolute 0.1 0 - 0.1 K/uL  Comprehensive metabolic panel     Status: Abnormal   Collection Time: 04/11/15  3:58 PM  Result Value Ref Range   Sodium 140 135 - 145 mmol/L   Potassium 3.6 3.5 - 5.1 mmol/L   Chloride 105 101 - 111 mmol/L   CO2 27 22 - 32 mmol/L   Glucose, Bld 161 (H) 65 - 99 mg/dL   BUN 13 6 - 20 mg/dL   Creatinine, Ser 1.15 0.61 - 1.24 mg/dL   Calcium 9.4 8.9 - 10.3 mg/dL   Total Protein 7.9 6.5 - 8.1 g/dL   Albumin 4.2 3.5 - 5.0 g/dL   AST 31 15 - 41 U/L   ALT 33 17 - 63 U/L   Alkaline Phosphatase 79 38 - 126 U/L   Total Bilirubin 0.7 0.3 - 1.2 mg/dL   GFR calc non Af Amer >60 >60 mL/min   GFR calc Af Amer >60 >60 mL/min    Comment: (NOTE) The eGFR has been calculated using the CKD EPI equation. This calculation has not been validated in all clinical situations. eGFR's persistently <60 mL/min signify possible Chronic Kidney Disease.    Anion gap 8 5 - 15  Ethanol     Status: None   Collection Time: 04/11/15  3:58 PM  Result Value Ref Range   Alcohol, Ethyl (B) <5 <5 mg/dL    Comment:        LOWEST DETECTABLE LIMIT FOR SERUM ALCOHOL IS 5 mg/dL FOR MEDICAL PURPOSES ONLY   Urine Drug Screen, Qualitative  ([facility] only)     Status: None   Collection Time: 04/11/15  4:15 PM  Result Value Ref Range   Tricyclic, Ur Screen NONE DETECTED NONE DETECTED   Amphetamines, Ur Screen NONE DETECTED NONE DETECTED   MDMA (Ecstasy)Ur Screen NONE DETECTED NONE DETECTED  Cocaine Metabolite,Ur [place] NONE DETECTED NONE DETECTED   Opiate, Ur Screen NONE DETECTED NONE DETECTED   Phencyclidine (PCP) Ur S NONE DETECTED NONE DETECTED   Cannabinoid 50 Ng, Ur [place] NONE DETECTED NONE DETECTED   Barbiturates, Ur Screen NONE DETECTED NONE DETECTED   Benzodiazepine, Ur Scrn NONE DETECTED NONE DETECTED   Methadone Scn, Ur NONE DETECTED NONE DETECTED    Comment: (NOTE) 326  Tricyclics, urine               Cutoff 1000 ng/mL 200  Amphetamines, urine             Cutoff 1000 ng/mL 300  MDMA (Ecstasy), urine           Cutoff 500 ng/mL 400  Cocaine Metabolite, urine       Cutoff 300 ng/mL 500  Opiate, urine                   Cutoff 300 ng/mL 600  Phencyclidine (PCP), urine      Cutoff 25 ng/mL 700  Cannabinoid, urine              Cutoff 50 ng/mL 800  Barbiturates, urine             Cutoff 200 ng/mL 900  Benzodiazepine, urine           Cutoff 200 ng/mL 1000 Methadone, urine                Cutoff 300 ng/mL 1100 1200 The urine drug screen provides only a preliminary, unconfirmed 1300 analytical test result and should not be used for non-medical 1400 purposes. Clinical consideration and professional judgment should 1500 be applied to any positive drug screen result due to possible 1600 interfering substances. A more specific alternate chemical method 1700 must be used in order to obtain a confirmed analytical result.  1800 Gas chromato graphy / mass spectrometry (GC/MS) is the preferred 1900 confirmatory method.   Urinalysis complete, with microscopic ([facility] only)     Status: Abnormal   Collection Time: 04/11/15  4:15 PM  Result Value Ref Range   Color, Urine YELLOW (A) YELLOW   APPearance CLEAR (A) CLEAR   Glucose, UA NEGATIVE  NEGATIVE mg/dL   Bilirubin Urine NEGATIVE NEGATIVE   Ketones, ur NEGATIVE NEGATIVE mg/dL   Specific Gravity, Urine 1.027 1.005 - 1.030   Hgb urine dipstick NEGATIVE NEGATIVE   pH 5.0 5.0 - 8.0   Protein, ur NEGATIVE NEGATIVE mg/dL   Nitrite NEGATIVE NEGATIVE   Leukocytes, UA NEGATIVE NEGATIVE   RBC / HPF 0-5 0 - 5 RBC/hpf   WBC, UA 0-5 0 - 5 WBC/hpf   Bacteria, UA NONE SEEN NONE SEEN   Squamous Epithelial / LPF NONE SEEN NONE SEEN   Mucous PRESENT     Vitals: Blood pressure 137/74, pulse 78, temperature 98.3 F (36.8 C), temperature source Oral, resp. rate 18, height 6' (1.829 m), weight 140.615 kg (310 lb), SpO2 98 %.  Risk to Self: Is patient at risk for suicide?: Yes Risk to Others:   Prior Inpatient Therapy:   Prior Outpatient Therapy:    No current facility-administered medications for this encounter.   No current outpatient prescriptions on file.    Musculoskeletal: Strength & Muscle Tone: within normal limits Gait & Station: normal Patient leans: N/A  Psychiatric Specialty Exam: Physical Exam  Constitutional: He appears well-developed and well-nourished. He appears listless. He has a sickly appearance.    HENT:  Head: Normocephalic and atraumatic.  Eyes: Conjunctivae are normal. Pupils are equal, round, and reactive to light.  Neck: Normal range of motion.  Cardiovascular: Normal heart sounds.   Respiratory: Effort normal.  GI: Soft.  Musculoskeletal: Normal range of motion.  Neurological: He appears listless.  Skin: Skin is warm and dry.     Psychiatric: His speech is delayed. He is slowed and withdrawn. Cognition and memory are normal. He expresses impulsivity and inappropriate judgment. He exhibits a depressed mood. He expresses suicidal ideation.  Patient appears to be very depressed. Having suicidal thoughts. Slowed thinking and concentration    Review of Systems  Constitutional: Positive for malaise/fatigue.  HENT: Negative.   Eyes: Negative.     Respiratory: Negative.   Cardiovascular: Negative.   Gastrointestinal: Negative.   Musculoskeletal: Negative.   Skin: Negative.   Neurological: Positive for weakness.  Psychiatric/Behavioral: Positive for depression, suicidal ideas, hallucinations and memory loss. Negative for substance abuse. The patient is nervous/anxious and has insomnia.     Blood pressure 137/74, pulse 78, temperature 98.3 F (36.8 C), temperature source Oral, resp. rate 18, height 6' (1.829 m), weight 140.615 kg (310 lb), SpO2 98 %.Body mass index is 42.03 kg/(m^2).  General Appearance: Disheveled and Patient is not only disheveled he is actually visibly dirty and malodorous as though he has not been taking care of himself at all for a while he barely moves although he makes good eye contact and converses appropriately  Eye Contact::  Good  Speech:  Slow  Volume:  Decreased  Mood:  Depressed  Affect:  Depressed  Thought Process:  Linear  Orientation:  Full (Time, Place, and Person)  Thought Content:  Hallucinations: Auditory  Suicidal Thoughts:  Yes.  with intent/plan  Homicidal Thoughts:  No  Memory:  Immediate;   Good Recent;   Fair Remote;   Fair  Judgement:  Impaired  Insight:  Shallow  Psychomotor Activity:  Decreased  Concentration:  Fair  Recall:  [place]  Language: Fair  Akathisia:  No  Handed:  Right  AIMS (if indicated):     Assets:  Communication Skills Desire for Improvement Housing Social Support  ADL's:  Intact  Cognition: WNL  Sleep:      Medical Decision Making: New problem, with additional work up planned, Review of Psycho-Social Stressors (1), Review or order clinical lab tests (1), Discuss test with performing physician (1) and Review of Medication Regimen & Side Effects (2)  Treatment Plan Summary: Medication management and Plan Patient appears to be severely depressed. Multiple symptoms of depression including depressed mood psychosocial withdrawal low  energy low concentration poor sleep and active suicidal thoughts. Also having hallucinations. No sign of any substance abuse or physical cause for his illness. Requires hospitalization for safety. Patient will be admitted to the psychiatry ward. He is agreeable to the plan although he is also under involuntary commitment. Start fluoxetine 20 mg a day as treatment. Close precautions in place.  Plan:  Recommend psychiatric Inpatient admission when medically cleared. Supportive therapy provided about ongoing stressors. Disposition: Case discussed with Dr. Robet Leu. Patient will be admitted to psychiatry  Alethia Berthold 04/11/2015 5:09 PM

## 2015-04-11 NOTE — ED Notes (Signed)
Report received from Bill RN. Patient care assumed. Patient/RN introduction complete. Will continue to monitor.  

## 2015-04-11 NOTE — BH Assessment (Signed)
Assessment Note  Jesse Pearson is an 44 y.o. male, who presents to the ED via his sister for c/o having depression with suicidal thoughts with plans to use a gun; or to set his house on fire; to wreck my car. Per client, "my wife left two months ago; she said that; she wanted her independence; that, she just doesn't love me no more; she left me after 20 years of marriage; she's staying with her mother. 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 No pertinent past medical history. Axis IV: other psychosocial or environmental problems, problems with access to health care services and problems with primary support group Axis V: 11-20 some danger of hurting self or others possible OR occasionally fails to maintain minimal personal hygiene OR gross impairment in communication  Past Medical History: History reviewed. No pertinent past medical history.  Past Surgical History  Procedure Laterality Date  . Appendectomy      Family History: History reviewed. No pertinent family history.  Social History:  reports that he has never smoked. He does not have any smokeless tobacco history on file. He reports that he does not drink alcohol. His drug history is not on file.  Additional Social History:     CIWA: CIWA-Ar BP: 137/74 mmHg Pulse Rate: 78 COWS:    Allergies:  Allergies  Allergen Reactions  . Cephalosporins Other (See Comments)    Reaction:  Unknown   . Clindamycin/Lincomycin Other (See Comments)    Reaction:  Unknown     Home Medications:  (Not in a hospital admission)  OB/GYN Status:  No LMP for male patient.  General Assessment Data Location of Assessment: [facility] ED TTS Assessment: In system Is this a Tele or Face-to-Face Assessment?: Face-to-Face Is this an Initial Assessment or a  Re-assessment for this encounter?: Initial Assessment Marital status: Separated Maiden name: none Is patient pregnant?: No Pregnancy Status: No Living Arrangements: Alone Can pt return to current living arrangement?: Yes Admission Status: Involuntary Is patient capable of signing voluntary admission?: Yes Referral Source: Self/Family/Friend Insurance type: [employer]  Medical Screening Exam [facility] Walk-in ONLY) Medical Exam completed: Yes  Crisis Care Plan Living Arrangements: Alone Name of Psychiatrist: Dr. Imogene Burn Name of Therapist: none  Education Status Is patient currently in school?: No Current Grade: n/a Highest grade of school patient has completed: 12th Name of school: n/a Contact person: daughter  Risk to self with the past 6 months Suicidal Ideation: Yes-Currently Present Has patient been a risk to self within the past 6 months prior to admission? : Yes Suicidal Intent: Yes-Currently Present Has patient had any suicidal intent within the past 6 months prior to admission? : Yes Is patient at risk for suicide?: Yes Suicidal Plan?: Yes-Currently Present Has patient had any suicidal plan within the past 6 months prior to admission? : Yes Specify Current Suicidal Plan: "to shoot himself with a gun; to set my house on fire." Access to Means: Yes Specify Access to Suicidal Means: has access to means What has been your use of drugs/alcohol within the last 12 months?: none Previous Attempts/Gestures: No How many times?: 0 Other Self Harm Risks: 0 Triggers for Past Attempts: None known Intentional Self Injurious Behavior: None Family Suicide History: No Recent stressful life event(s): Conflict (Comment) (wife left 2 months ago) Persecutory voices/beliefs?: No Depression: Yes Depression Symptoms: Despondent,  Tearfulness, Feeling worthless/self pity, Loss of interest in usual pleasures Substance abuse history and/or treatment for substance abuse?: No Suicide prevention  information given to non-admitted patients: Yes  Risk to Others within the past 6 months Homicidal Ideation: No Does patient have any lifetime risk of violence toward others beyond the six months prior to admission? : No Thoughts of Harm to Others: No Current Homicidal Intent: No Current Homicidal Plan: No Access to Homicidal Means: No Identified Victim: none History of harm to others?: No Assessment of Violence: On admission Violent Behavior Description: none Does patient have access to weapons?: No Criminal Charges Pending?: No Does patient have a court date: No Is patient on probation?: No  Psychosis Hallucinations: None noted Delusions: None noted  Mental Status Report Appearance/Hygiene: Body odor, Disheveled, Poor hygiene, In scrubs Eye Contact: Fair Motor Activity: Unremarkable Speech: Logical/coherent Level of Consciousness: Alert Mood: Depressed, Helpless, Sad Affect: Depressed, Sad Anxiety Level: Minimal Thought Processes: Coherent, Circumstantial Judgement: Impaired Orientation: Person, Place, Situation Obsessive Compulsive Thoughts/Behaviors: Moderate  Cognitive Functioning Concentration: Fair Memory: Recent Intact, Remote Intact IQ: Average Insight: Fair Impulse Control: Fair Appetite: Fair Weight Loss: 0 Weight Gain: 0 Sleep: Decreased Total Hours of Sleep: 4 Vegetative Symptoms: Decreased grooming, Not bathing  ADLScreening [facility] Assessment Services) Patient's cognitive ability adequate to safely complete daily activities?: Yes Patient able to express need for assistance with ADLs?: Yes Independently performs ADLs?: Yes (appropriate for developmental age)  Prior Inpatient Therapy Prior Inpatient Therapy: No Prior Therapy Dates: none Prior Therapy Facilty/Provider(s): none  Prior Outpatient Therapy Prior Outpatient Therapy: No Does patient have an ACCT team?: No Does patient have Intensive In-House Services?  : No Does patient have [facility]  services? : No Does patient have P4CC services?: No  ADL Screening (condition at time of admission) Patient's cognitive ability adequate to safely complete daily activities?: Yes Patient able to express need for assistance with ADLs?: Yes Independently performs ADLs?: Yes (appropriate for developmental age)       Abuse/Neglect Assessment (Assessment to be complete while patient is alone) Physical Abuse: Denies Verbal Abuse: Denies Sexual Abuse: Denies Exploitation of patient/patient's resources: Denies Self-Neglect: Denies Values / Beliefs Cultural Requests During Hospitalization: None Spiritual Requests During Hospitalization: None Consults Spiritual Care Consult Needed: No Social Work Consult Needed: No Merchant navy officer (For Healthcare) Does patient have an advance directive?: No Would patient like information on creating an advanced directive?: No - patient declined information    Additional Information 1:1 In Past 12 Months?: No CIRT Risk: No Elopement Risk: No Does patient have medical clearance?: Yes  Child/Adolescent Assessment Running Away Risk: Denies Bed-Wetting: Denies Destruction of Property: Denies Cruelty to Animals: Denies Stealing: Denies Rebellious/Defies Authority: Denies Satanic Involvement: Denies Archivist: Denies Problems at [employer]: Denies Gang Involvement: Denies  Disposition:  Disposition Initial Assessment Completed for this Encounter: Yes Disposition of Patient: Inpatient treatment program Type of inpatient treatment program: Adult  On Site Evaluation by:   Reviewed with Physician:    Dwan Bolt 04/11/2015 6:31 PM

## 2015-04-11 NOTE — ED Notes (Addendum)
BEHAVIORAL HEALTH ROUNDING Patient sleeping: yes Patient alert and oriented: yes Behavior appropriate: yes  Nutrition and fluids offered: yes, pt denies at this time Toileting and hygiene offered: yes Sitter present: yes Law enforcement present:: yes, ods

## 2015-04-11 NOTE — ED Notes (Signed)
Pt in dayroom watching tv, will continue to monitor.

## 2015-04-11 NOTE — ED Notes (Signed)
Med given per md order, no further orders.

## 2015-04-11 NOTE — ED Provider Notes (Signed)
Patient been admitted to psychiatric unit by Dr. Floyde Parkins, MD 04/11/15 [phone]

## 2015-04-11 NOTE — ED Notes (Signed)
Pt sitting in dayroom at this time calm and cooperative without complaints.  Pt awaiting admission to beh med unit, will continue to monitor.

## 2015-04-11 NOTE — ED Notes (Signed)

## 2015-04-11 NOTE — ED Notes (Signed)
Reports recent depression x 2 wks, thoughts about hurting himself

## 2015-04-11 NOTE — ED Notes (Signed)
Report called to [facility], pt admitted to beh med unit.

## 2015-04-12 NOTE — [facility] Notes (Signed)
Jesse Pearson also stated in admission that he has sleep apnea and uses cpap machine but only uses 1-2 times per month.

## 2015-04-12 NOTE — H&P (Signed)
Psychiatric Admission Assessment Adult  Patient Identification: Jesse Pearson MRN:  [id number] Date of Evaluation:  04/12/2015 Chief Complaint:  Major depression Principal Diagnosis: <principal problem not specified> Diagnosis:   Patient Active Problem List   Diagnosis Date Noted  . 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 Suicidal ideation [R45.851] 04/11/2015  . Sleep apnea [G47.30] 04/11/2015   History of Present Illness::  Identifying data. The patient is a 44 year old male with no past psychiatric history.  Chief complaint. "My wife left me."  History of present illness. Jesse Pearson has no psychiatric past. He has been depressed for the past 2 months after his wife informed him that after 21 years of marriage she is living. He became increasingly depressed with poor sleep, decreased appetite, anhedonia, feeling of guilt and worthlessness hopelessness, poor energy and concentration, social isolation, crying spells, and suicidal ideation. He was able to think of multiple ways to go. He has been intrusive thoughts of killing himself but denies ever attempting to hurt himself. He does not feel particularly anxious. He denies any psychotic symptoms. He denies symptoms suggestive of bipolar mania. He does not use alcohol or illicit drugs or prescription pills.  Past psychiatric history. He's never been hospitalized. No suicide attempts.  Family psychiatric history. None reported.  Social history. He's been married for 21 years. He lives with 76 year old son. He is employed at the [employer]. He has been with the same company for over 20 years. Additional tests is the fact that he missed some payments at his mother's nursing facility. He has to pay back in installments. This puts him in financial stress.  Total Time spent with patient: 1 hour  Past Medical History:  Past Medical History  Diagnosis Date  . Sleep apnea   .  Anxiety     Past Surgical History  Procedure Laterality Date  . Appendectomy     Family History: History reviewed. No pertinent family history. Social History:  History  Alcohol Use No     History  Drug Use Not on file    History   Social History  . Marital Status: Married    Spouse Name: N/A  . Number of Children: N/A  . Years of Education: N/A   Social History Main Topics  . Smoking status: Never Smoker   . Smokeless tobacco: Not on file  . Alcohol Use: No  . Drug Use: Not on file  . Sexual Activity: Not on file   Other Topics Concern  . None   Social History Narrative   Additional Social History:    History of alcohol / drug use?: No history of alcohol / drug abuse Withdrawal Symptoms: Anorexia                     Musculoskeletal: Strength & Muscle Tone: within normal limits Gait & Station: normal Patient leans: N/A  Psychiatric Specialty Exam: Physical Exam  Nursing note and vitals reviewed.   Review of Systems  All other systems reviewed and are negative.   Blood pressure 107/77, pulse 109, temperature 98.2 F (36.8 C), temperature source Oral, resp. rate 18, height 6' (1.829 m), weight 142.883 kg (315 lb), SpO2 95 %.Body mass index is 42.71 kg/(m^2).  See SRA.  Sleep:      Risk to Self: Is patient at risk for suicide?: Yes Risk to Others:   Prior Inpatient Therapy:   Prior Outpatient Therapy:    Alcohol Screening: Patient refused Alcohol Screening Tool: Yes 1. How often do you have a drink containing alcohol?: Never 9. Have you or someone else been injured as a result of your drinking?: No 10. Has a relative or friend or a doctor or another health worker been concerned about your drinking or suggested you cut down?: No Alcohol Use Disorder Identification Test Final Score (AUDIT): 0 Brief Intervention: AUDIT score less than 7 or less-screening does not suggest  unhealthy drinking-brief intervention not indicated  Allergies:   Allergies  Allergen Reactions  . Cephalosporins Other (See Comments)    Reaction:  Unknown   . Clindamycin/Lincomycin Other (See Comments)    Reaction:  Unknown    Lab Results:  Results for orders placed or performed during the hospital encounter of 04/11/15 (from the past 48 hour(s))  CBC WITH DIFFERENTIAL     Status: Abnormal   Collection Time: 04/11/15  3:58 PM  Result Value Ref Range   WBC 10.1 3.8 - 10.6 K/uL   RBC 5.60 4.40 - 5.90 MIL/uL   Hemoglobin 17.3 13.0 - 18.0 g/dL   HCT 53.0 (H) 40.0 - 52.0 %   MCV 94.7 80.0 - 100.0 fL   MCH 31.0 26.0 - 34.0 pg   MCHC 32.7 32.0 - 36.0 g/dL   RDW 14.0 11.5 - 14.5 %   Platelets 203 150 - 440 K/uL   Neutrophils Relative % 75 %   Neutro Abs 7.6 (H) 1.4 - 6.5 K/uL   Lymphocytes Relative 16 %   Lymphs Abs 1.6 1.0 - 3.6 K/uL   Monocytes Relative 7 %   Monocytes Absolute 0.7 0.2 - 1.0 K/uL   Eosinophils Relative 1 %   Eosinophils Absolute 0.1 0 - 0.7 K/uL   Basophils Relative 1 %   Basophils Absolute 0.1 0 - 0.1 K/uL  Comprehensive metabolic panel     Status: Abnormal   Collection Time: 04/11/15  3:58 PM  Result Value Ref Range   Sodium 140 135 - 145 mmol/L   Potassium 3.6 3.5 - 5.1 mmol/L   Chloride 105 101 - 111 mmol/L   CO2 27 22 - 32 mmol/L   Glucose, Bld 161 (H) 65 - 99 mg/dL   BUN 13 6 - 20 mg/dL   Creatinine, Ser 1.15 0.61 - 1.24 mg/dL   Calcium 9.4 8.9 - 10.3 mg/dL   Total Protein 7.9 6.5 - 8.1 g/dL   Albumin 4.2 3.5 - 5.0 g/dL   AST 31 15 - 41 U/L   ALT 33 17 - 63 U/L   Alkaline Phosphatase 79 38 - 126 U/L   Total Bilirubin 0.7 0.3 - 1.2 mg/dL   GFR calc non Af Amer >60 >60 mL/min   GFR calc Af Amer >60 >60 mL/min    Comment: (NOTE) The eGFR has been calculated using the CKD EPI equation. This calculation has not been validated in all clinical situations. eGFR's persistently <60 mL/min signify possible Chronic Kidney Disease.    Anion gap 8 5 -  15  Ethanol     Status: None   Collection Time: 04/11/15  3:58 PM  Result Value Ref Range   Alcohol, Ethyl (B) <5 <5 mg/dL    Comment:        LOWEST DETECTABLE LIMIT FOR SERUM ALCOHOL IS 5 mg/dL FOR  MEDICAL PURPOSES ONLY   Urine Drug Screen, Qualitative ([place] only)     Status: None   Collection Time: 04/11/15  4:15 PM  Result Value Ref Range   Tricyclic, Ur Screen NONE DETECTED NONE DETECTED   Amphetamines, Ur Screen NONE DETECTED NONE DETECTED   MDMA (Ecstasy)Ur Screen NONE DETECTED NONE DETECTED   Cocaine Metabolite,Ur [place] NONE DETECTED NONE DETECTED   Opiate, Ur Screen NONE DETECTED NONE DETECTED   Phencyclidine (PCP) Ur S NONE DETECTED NONE DETECTED   Cannabinoid 50 Ng, Ur [place] NONE DETECTED NONE DETECTED   Barbiturates, Ur Screen NONE DETECTED NONE DETECTED   Benzodiazepine, Ur Scrn NONE DETECTED NONE DETECTED   Methadone Scn, Ur NONE DETECTED NONE DETECTED    Comment: (NOTE) 673  Tricyclics, urine               Cutoff 1000 ng/mL 200  Amphetamines, urine             Cutoff 1000 ng/mL 300  MDMA (Ecstasy), urine           Cutoff 500 ng/mL 400  Cocaine Metabolite, urine       Cutoff 300 ng/mL 500  Opiate, urine                   Cutoff 300 ng/mL 600  Phencyclidine (PCP), urine      Cutoff 25 ng/mL 700  Cannabinoid, urine              Cutoff 50 ng/mL 800  Barbiturates, urine             Cutoff 200 ng/mL 900  Benzodiazepine, urine           Cutoff 200 ng/mL 1000 Methadone, urine                Cutoff 300 ng/mL 1100 1200 The urine drug screen provides only a preliminary, unconfirmed 1300 analytical test result and should not be used for non-medical 1400 purposes. Clinical consideration and professional judgment should 1500 be applied to any positive drug screen result due to possible 1600 interfering substances. A more specific alternate chemical method 1700 must be used in order to obtain a confirmed analytical result.  1800 Gas chromato graphy / mass spectrometry (GC/MS) is  the preferred 1900 confirmatory method.   Urinalysis complete, with microscopic ([facility] only)     Status: Abnormal   Collection Time: 04/11/15  4:15 PM  Result Value Ref Range   Color, Urine YELLOW (A) YELLOW   APPearance CLEAR (A) CLEAR   Glucose, UA NEGATIVE NEGATIVE mg/dL   Bilirubin Urine NEGATIVE NEGATIVE   Ketones, ur NEGATIVE NEGATIVE mg/dL   Specific Gravity, Urine 1.027 1.005 - 1.030   Hgb urine dipstick NEGATIVE NEGATIVE   pH 5.0 5.0 - 8.0   Protein, ur NEGATIVE NEGATIVE mg/dL   Nitrite NEGATIVE NEGATIVE   Leukocytes, UA NEGATIVE NEGATIVE   RBC / HPF 0-5 0 - 5 RBC/hpf   WBC, UA 0-5 0 - 5 WBC/hpf   Bacteria, UA NONE SEEN NONE SEEN   Squamous Epithelial / LPF NONE SEEN NONE SEEN   Mucous PRESENT    Current Medications: Current Facility-Administered Medications  Medication Dose Route Frequency Provider Last Rate Last Dose  . acetaminophen (TYLENOL) tablet 650 mg  650 mg Oral Q6H PRN Gonzella Lex, MD      . alum & mag hydroxide-simeth (MAALOX/MYLANTA) 200-200-20 MG/5ML suspension 30 mL  30 mL Oral Q4H PRN Gonzella Lex, MD      .  FLUoxetine (PROZAC) capsule 20 mg  20 mg Oral Daily Gonzella Lex, MD   20 mg at 04/12/15 1000  . magnesium hydroxide (MILK OF MAGNESIA) suspension 30 mL  30 mL Oral Daily PRN Gonzella Lex, MD       PTA Medications: Prescriptions prior to admission  Medication Sig Dispense Refill Last Dose  . tobramycin-dexamethasone (TOBRADEX) ophthalmic solution Place 1 drop into the right eye QID. For 7 days. Picked up on 03-16-15   Past Month at Unknown time    Previous Psychotropic Medications: No   Substance Abuse History in the last 12 months:  No.    Consequences of Substance Abuse: Negative  Results for orders placed or performed during the hospital encounter of 04/11/15 (from the past 72 hour(s))  CBC WITH DIFFERENTIAL     Status: Abnormal   Collection Time: 04/11/15  3:58 PM  Result Value Ref Range   WBC 10.1 3.8 - 10.6 K/uL   RBC 5.60  4.40 - 5.90 MIL/uL   Hemoglobin 17.3 13.0 - 18.0 g/dL   HCT 53.0 (H) 40.0 - 52.0 %   MCV 94.7 80.0 - 100.0 fL   MCH 31.0 26.0 - 34.0 pg   MCHC 32.7 32.0 - 36.0 g/dL   RDW 14.0 11.5 - 14.5 %   Platelets 203 150 - 440 K/uL   Neutrophils Relative % 75 %   Neutro Abs 7.6 (H) 1.4 - 6.5 K/uL   Lymphocytes Relative 16 %   Lymphs Abs 1.6 1.0 - 3.6 K/uL   Monocytes Relative 7 %   Monocytes Absolute 0.7 0.2 - 1.0 K/uL   Eosinophils Relative 1 %   Eosinophils Absolute 0.1 0 - 0.7 K/uL   Basophils Relative 1 %   Basophils Absolute 0.1 0 - 0.1 K/uL  Comprehensive metabolic panel     Status: Abnormal   Collection Time: 04/11/15  3:58 PM  Result Value Ref Range   Sodium 140 135 - 145 mmol/L   Potassium 3.6 3.5 - 5.1 mmol/L   Chloride 105 101 - 111 mmol/L   CO2 27 22 - 32 mmol/L   Glucose, Bld 161 (H) 65 - 99 mg/dL   BUN 13 6 - 20 mg/dL   Creatinine, Ser 1.15 0.61 - 1.24 mg/dL   Calcium 9.4 8.9 - 10.3 mg/dL   Total Protein 7.9 6.5 - 8.1 g/dL   Albumin 4.2 3.5 - 5.0 g/dL   AST 31 15 - 41 U/L   ALT 33 17 - 63 U/L   Alkaline Phosphatase 79 38 - 126 U/L   Total Bilirubin 0.7 0.3 - 1.2 mg/dL   GFR calc non Af Amer >60 >60 mL/min   GFR calc Af Amer >60 >60 mL/min    Comment: (NOTE) The eGFR has been calculated using the CKD EPI equation. This calculation has not been validated in all clinical situations. eGFR's persistently <60 mL/min signify possible Chronic Kidney Disease.    Anion gap 8 5 - 15  Ethanol     Status: None   Collection Time: 04/11/15  3:58 PM  Result Value Ref Range   Alcohol, Ethyl (B) <5 <5 mg/dL    Comment:        LOWEST DETECTABLE LIMIT FOR SERUM ALCOHOL IS 5 mg/dL FOR MEDICAL PURPOSES ONLY   Urine Drug Screen, Qualitative ([facility] only)     Status: None   Collection Time: 04/11/15  4:15 PM  Result Value Ref Range   Tricyclic, Ur Screen NONE DETECTED NONE DETECTED  Amphetamines, Ur Screen NONE DETECTED NONE DETECTED   MDMA (Ecstasy)Ur Screen NONE DETECTED NONE  DETECTED   Cocaine Metabolite,Ur [place] NONE DETECTED NONE DETECTED   Opiate, Ur Screen NONE DETECTED NONE DETECTED   Phencyclidine (PCP) Ur S NONE DETECTED NONE DETECTED   Cannabinoid 50 Ng, Ur [place] NONE DETECTED NONE DETECTED   Barbiturates, Ur Screen NONE DETECTED NONE DETECTED   Benzodiazepine, Ur Scrn NONE DETECTED NONE DETECTED   Methadone Scn, Ur NONE DETECTED NONE DETECTED    Comment: (NOTE) 950  Tricyclics, urine               Cutoff 1000 ng/mL 200  Amphetamines, urine             Cutoff 1000 ng/mL 300  MDMA (Ecstasy), urine           Cutoff 500 ng/mL 400  Cocaine Metabolite, urine       Cutoff 300 ng/mL 500  Opiate, urine                   Cutoff 300 ng/mL 600  Phencyclidine (PCP), urine      Cutoff 25 ng/mL 700  Cannabinoid, urine              Cutoff 50 ng/mL 800  Barbiturates, urine             Cutoff 200 ng/mL 900  Benzodiazepine, urine           Cutoff 200 ng/mL 1000 Methadone, urine                Cutoff 300 ng/mL 1100 1200 The urine drug screen provides only a preliminary, unconfirmed 1300 analytical test result and should not be used for non-medical 1400 purposes. Clinical consideration and professional judgment should 1500 be applied to any positive drug screen result due to possible 1600 interfering substances. A more specific alternate chemical method 1700 must be used in order to obtain a confirmed analytical result.  1800 Gas chromato graphy / mass spectrometry (GC/MS) is the preferred 1900 confirmatory method.   Urinalysis complete, with microscopic ([facility] only)     Status: Abnormal   Collection Time: 04/11/15  4:15 PM  Result Value Ref Range   Color, Urine YELLOW (A) YELLOW   APPearance CLEAR (A) CLEAR   Glucose, UA NEGATIVE NEGATIVE mg/dL   Bilirubin Urine NEGATIVE NEGATIVE   Ketones, ur NEGATIVE NEGATIVE mg/dL   Specific Gravity, Urine 1.027 1.005 - 1.030   Hgb urine dipstick NEGATIVE NEGATIVE   pH 5.0 5.0 - 8.0   Protein, ur NEGATIVE NEGATIVE mg/dL    Nitrite NEGATIVE NEGATIVE   Leukocytes, UA NEGATIVE NEGATIVE   RBC / HPF 0-5 0 - 5 RBC/hpf   WBC, UA 0-5 0 - 5 WBC/hpf   Bacteria, UA NONE SEEN NONE SEEN   Squamous Epithelial / LPF NONE SEEN NONE SEEN   Mucous PRESENT     Observation Level/Precautions:  15 minute checks  Laboratory:  CBC Chemistry Profile UDS UA  Psychotherapy:    Medications:    Consultations:    Discharge Concerns:    Estimated LOS:  Other:     Psychological Evaluations: No   Treatment Plan Summary: Daily contact with patient to assess and evaluate symptoms and progress in treatment and Medication management  Medical Decision Making:  New problem, with additional work up planned, Review of Psycho-Social Stressors (1), Review or order clinical lab tests (1) and Review of Medication Regimen & Side Effects (2)   Mr.  Greb is a 44 year old male with no past psychiatric history admitted for suicidal ideation with a plan in the context of marital problems.  1. Suicidal ideation. He still feels suicidal. He is able to contract for safety in the hospital. He is on 15 minutes checks.  2. Mood. He was started on Prozac for depression . 3. Insomnia. We will start trazodone for sleep.  4. Disposition. He will be discharged to home he will follow up with one of the local providers. Family meeting would be desirable. I do not think that he is estranged wife would come but may be his sister who is major support.   I certify that inpatient services furnished can reasonably be expected to improve the patient's condition.   Emmie Frakes 6/14/20162:53 PM
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
## 2015-04-12 NOTE — Progress Notes (Signed)
D. Calm and cooperative with assessment. No acute distressed noted. Voice of suicidal ideation  and contract for safety. Offered no additional question or concerns. Appetite fair . Voice of concentration poor. Energy level low  Depression 8, hopelessness 7 and anxiety 8 ( Low 0-10 high )  A: safety has been maintained with Q15 minutes observation. Support and encouragement provided  Scheduled medication given verbalize understanding ..  R: Patient remains safe.  he is complaint with medication and group programming. Safety has been maintained Q15
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
## 2015-04-12 NOTE — Progress Notes (Signed)
Recreation Therapy Notes  Date: 06.14.16 Time: 3:00 pm Location: Craft Room  Group Topic: Goal Setting  Goal Area(s) Addresses:  Patient will list at least one goal. Patient will list at least one obstacle.  Behavioral Response: Attentive, Interactive  Intervention: Recovery Goal Chart  Activity: Patients were instructed to make a goal chart with goals, obstacles, the date they started working on their goal, and the date they achieved their goal.   Education: LRT educated patients on different healthy ways they can celebrate reaching their goal.   Education Outcome: Acknowledges education/In group clarification offered  Clinical Observations/Feedback: Patient completed activity. Patient contributed to group discussion by stating how he can keep himself focused on his goals.  Jacquelynn Cree, LRT/CTRS 04/12/2015 4:32 PM

## 2015-04-12 NOTE — [facility] Suicide Risk Assessment (Signed)
[facility] Admission Suicide Risk Assessment   Nursing information obtained from:    Demographic factors:    Current Mental Status:    Loss Factors:    Historical Factors:    Risk Reduction Factors:    Total Time spent with patient: 1 hour Principal Problem: <principal problem not specified> Diagnosis:   Patient Active Problem List   Diagnosis Date Noted  . 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 Suicidal ideation [R45.851] 04/11/2015  . Sleep apnea [G47.30] 04/11/2015     Continued Clinical Symptoms:  Alcohol Use Disorder Identification Test Final Score (AUDIT): 0 The "Alcohol Use Disorders Identification Test", Guidelines for Use in Primary Care, Second Edition.  World Science writer [facility]). Score between 0-7:  no or low risk or alcohol related problems. Score between 8-15:  moderate risk of alcohol related problems. Score between 16-19:  high risk of alcohol related problems. Score 20 or above:  warrants further diagnostic evaluation for alcohol dependence and treatment.   CLINICAL FACTORS:   Depression:   Severe   Musculoskeletal: Strength & Muscle Tone: within normal limits Gait & Station: normal Patient leans: N/A  Psychiatric Specialty Exam: I concur with finding of PE performed in the ER. Physical Exam  Nursing note and vitals reviewed.   Review of Systems  All other systems reviewed and are negative.   Blood pressure 107/77, pulse 109, temperature 98.2 F (36.8 C), temperature source Oral, resp. rate 18, height 6' (1.829 m), weight 142.883 kg (315 lb), SpO2 95 %.Body mass index is 42.71 kg/(m^2).  General Appearance: Casual  Eye Contact::  Poor  Speech:  Slow  Volume:  Decreased  Mood:  Depressed  Affect:  Tearful  Thought Process:  Logical  Orientation:  Full (Time, Place, and Person)  Thought Content:  WDL  Suicidal Thoughts:  No  Homicidal Thoughts:  No  Memory:  Immediate;   Fair Recent;   Fair Remote;    Fair  Judgement:  Fair  Insight:  Fair  Psychomotor Activity:  Decreased  Concentration:  Fair  Recall:  [employer] of Knowledge:Fair  Language: Fair  Akathisia:  No  Handed:  Right  AIMS (if indicated):     Assets:  Communication Skills Desire for Improvement Financial Resources/Insurance Housing Social Support  Sleep:     Cognition: WNL  ADL's:  Intact     COGNITIVE FEATURES THAT CONTRIBUTE TO RISK:  None    SUICIDE RISK:   Severe:  Frequent, intense, and enduring suicidal ideation, specific plan, no subjective intent, but some objective markers of intent (i.e., choice of lethal method), the method is accessible, some limited preparatory behavior, evidence of impaired self-control, severe dysphoria/symptomatology, multiple risk factors present, and few if any protective factors, particularly a lack of social support.  PLAN OF CARE: hospital admission, medication management, discharge planning.  Medical Decision Making:  New problem, with additional work up planned, Review of Psycho-Social Stressors (1), Review or order clinical lab tests (1) and Review of Medication Regimen & Side Effects (2)   Mr. Corron is a 44 year old male with no past psychiatric history admitted for suicidal ideation with a plan in the context of marital problems.  1. Suicidal ideation. He still feels suicidal. He is able to contract for safety in the hospital. He is on 15 minutes checks.  2. Mood. He was started on Prozac for depression . 3. Insomnia. We will start trazodone for sleep.  4. Disposition. He will be discharged to home he  will follow up with one of the local providers. Family meeting would be desirable. I do not think that he is estranged wife would come but may be his sister who is major support.  I certify that inpatient services furnished can reasonably be expected to improve the patient's condition.   Dennard Vezina 04/12/2015, 2:48 PM

## 2015-04-12 NOTE — Progress Notes (Addendum)
Initial Nutrition Assessment  DOCUMENTATION CODES:     INTERVENTION:   (Meals and snacks:) Cater to pt prefences Medical Nutrition Supplement:  If unable to meet nutritional needs during admission will add supplement  NUTRITION DIAGNOSIS:  Inadequate oral intake related to acute illness as evidenced by percent weight loss.    GOAL:  Patient will meet greater than or equal to 90% of their needs    MONITOR:   (Energy intake)  REASON FOR ASSESSMENT:  Malnutrition Screening Tool    ASSESSMENT:  Pt admitted with major depression  PMHx:  Past Medical History  Diagnosis Date  . Sleep apnea   . Anxiety     Current Nutrition: Ate 80% of breakfast this am   Nutrition Prior to Admission poor po intake for the past 2-3 months secondary to depression   Labs: glucose profile: glucose 131   Medications: reviewed   Weight Change: 10% weight loss in the last 2-3 months per pt report   Height:  Ht Readings from Last 1 Encounters:  04/11/15 6' (1.829 m)    Weight:  Wt Readings from Last 1 Encounters:  04/11/15 315 lb (142.883 kg)       Wt Readings from Last 10 Encounters:  04/11/15 315 lb (142.883 kg)  04/11/15 310 lb (140.615 kg)    BMI:  Body mass index is 42.71 kg/(m^2).       Estimated energy needs:  Kcals: Using IBW of 81kg BEE 1748 kcals (AF 1.3) 2272 kcals/d  Protein: (.8-1.0 g/d) 65-81 g/d  Fluid: (25-25ml/kg) 2025-2427ml/d  Diet Order:  Diet regular Room service appropriate?: Yes; Fluid consistency:: Thin  EDUCATION NEEDS:  No education needs identified at this time   Intake/Output Summary (Last 24 hours) at 04/12/15 1228 Last data filed at 04/12/15 0956  Gross per 24 hour  Intake    360 ml  Output      0 ml  Net    360 ml    Moderate Care Level Chalet Kerwin B. Freida Busman, RD, LDN [phone] (pager)

## 2015-04-13 ENCOUNTER — Inpatient Hospital Stay: Payer: PRIVATE HEALTH INSURANCE

## 2015-04-13 MED ORDER — ARIPIPRAZOLE 2 MG PO TABS
2.0000 mg | ORAL_TABLET | Freq: Every day | ORAL | Status: DC
  Administered 2015-04-13 – 2015-04-15 (×3): 2 mg via ORAL
  Filled 2015-04-13 (×5): qty 1

## 2015-04-13 NOTE — Plan of Care (Signed)
Problem: [facility] Participation in Recreation Therapeutic Interventions Goal: STG-Patient will demonstrate improved self esteem by identif STG: Self-Esteem - Within 5 treatment sessions, patient will verbalize at least 5 positive affirmation statements in each of 3 treatment sessions to increase self-esteem post d/c.  Outcome: Progressing Treatment Session 1; Completed 1 out of 3: At approximately 3:50 pm, LRT met with patient in patient room. Patient verbalized 5 positive affirmation statements. Patient reported it felt "good". LRT encouraged patient to continue saying positive affirmation statements.  Leonette Monarch, LRT/CTRS 06.15.16 4:49 pm Goal: STG-Other Recreation Therapy Goal (Specify) STG: Stress Management - Within 5 treatment sessions, patient will demonstrate at least one stress management technique in each of 2 treatment sessions to increase stress management skills post d/c.  Outcome: Progressing Treatment Session 1; Completed 0 out of 2: At approximately 3:50 pm, LRT met with patient in patient room. LRT educated and provided patient with handouts on stress management techniques. Patient verbalized understanding. LRT encouraged patient to read over and practice the stress management techniques.  Leonette Monarch, LRT/CTRS 06.15.16 4:51 pm

## 2015-04-13 NOTE — Plan of Care (Signed)
Problem: Alteration in mood Goal: LTG-Patient reports reduction in suicidal thoughts (Patient reports reduction in suicidal thoughts and is able to verbalize a safety plan for whenever patient is feeling suicidal)  Outcome: Not Applicable Date Met:  41/42/39 No injuries noted. No voiced thoughts of hurting himself. q 15 min checks maintained for safety. No c/o pain/discomfort noted.

## 2015-04-13 NOTE — [facility] Group Notes (Signed)
[facility] LCSW Group Therapy  04/13/2015 7:34 AM  Type of Therapy:  Group Therapy  Participation Level:  Active  Participation Quality:  Attentive  Affect:  Appropriate  Cognitive:  Appropriate  Insight:  Engaged  Engagement in Therapy:  Engaged  Modes of Intervention:  Discussion, Education, Exploration and Support  Summary of Progress/Problems:LCSW introduced group rules and today's discussion was on Understanding SELF, patients as a whole group were asked to explore their false beliefs, how they address them to form insight about issues,thoughts or feelings. Each person was asked to reflect on what is important to them and communications styles were discussed to assist with individuals reaching their goals of self discovery. This patient was insightful and supportive of  Peers he reported he has a lot of personal issues- ex wife, depression that keeps him down. He was encouraged by peers to see the positives in his life.Johnella Moloney, Lani Havlik M 04/13/2015, 7:34 AM

## 2015-04-13 NOTE — Progress Notes (Signed)
Calm and cooperative. Flat affect. OOB to DR watching tv. Limited interaction with peers. dishelved with foul odor noted. Bright reddened rash acrose forehead and jaw line noted. Temp 98.2. Limited interaction with peers and staff. No behavior problems noted. Denies SI/HI. No AV/H noted. No c/o pain/discomfort noted.

## 2015-04-13 NOTE — [facility] Group Notes (Signed)
[facility] Group Notes:  (Nursing/MHT/Case Management/Adjunct)  Date:  04/13/2015  Time:  12:10 PM  Type of Therapy:  Psychoeducational Skills  Participation Level:  Active  Participation Quality:  Appropriate and Sharing  Affect:  Appropriate and Flat  Cognitive:  Appropriate  Insight:  Good  Engagement in Group:  Improving  Modes of Intervention:  Discussion, Education, Problem-solving and Support  Summary of Progress/Problems:  Jesse Pearson Jesse Pearson 04/13/2015, 12:10 PM

## 2015-04-13 NOTE — Plan of Care (Signed)
Problem: Diagnosis: Increased Risk For Suicide Attempt Goal: LTG-Patient Will Report Improvement in Psychotic Symptoms LTG (by discharge) : Patient will report improvement in psychotic symptoms.  Outcome: Progressing Attended groups & compliant with meds.

## 2015-04-13 NOTE — Progress Notes (Signed)
[facility] MD Progress Note  04/13/2015 11:43 AM Jesse Pearson  MRN:  [id number]  Subjective:  Jesse Pearson feels depressed, hopeless, and vaguely suicidal. He does not have a plan in the hospital and is able to contract for safety but would not be safe at home. He denies psychotic symptoms. He slept 6 hours last night but does not feel rested. Appetite is poor. He started participating in groups but finds it very difficult and anxiety provoking. He tolerates Prozac well. He complains of severe cough that is productive. He is headed for several months now. Often times he loses sleep. He also gets short of breath lately.  Principal Problem: Severe major depression with psychotic features, mood-congruent Diagnosis:   Patient Active Problem List   Diagnosis Date Noted  . 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 Suicidal ideation [R45.851] 04/11/2015  . Sleep apnea [G47.30] 04/11/2015   Total Time spent with patient: 20 minutes   Past Medical History:  Past Medical History  Diagnosis Date  . Sleep apnea   . Anxiety     Past Surgical History  Procedure Laterality Date  . Appendectomy     Family History: History reviewed. No pertinent family history. Social History:  History  Alcohol Use No     History  Drug Use Not on file    History   Social History  . Marital Status: Married    Spouse Name: N/A  . Number of Children: N/A  . Years of Education: N/A   Social History Main Topics  . Smoking status: Never Smoker   . Smokeless tobacco: Not on file  . Alcohol Use: No  . Drug Use: Not on file  . Sexual Activity: Not on file   Other Topics Concern  . None   Social History Narrative   Additional History:    Sleep: Fair  Appetite:  Fair   Assessment:   Musculoskeletal: Strength & Muscle Tone: within normal limits Gait & Station: normal Patient leans: N/A   Psychiatric Specialty Exam: Physical Exam  Nursing note and  vitals reviewed.   Review of Systems  All other systems reviewed and are negative.   Blood pressure 101/74, pulse 118, temperature 98.2 F (36.8 C), temperature source Oral, resp. rate 18, height 6' (1.829 m), weight 142.883 kg (315 lb), SpO2 95 %.Body mass index is 42.71 kg/(m^2).  General Appearance: Casual  Eye Contact::  Minimal  Speech:  Slow  Volume:  Decreased  Mood:  Depressed  Affect:  Tearful  Thought Process:  Goal Directed and Linear  Orientation:  Full (Time, Place, and Person)  Thought Content:  WDL  Suicidal Thoughts:  Yes.  without intent/plan  Homicidal Thoughts:  No  Memory:  Immediate;   Fair Recent;   Fair Remote;   Fair  Judgement:  Fair  Insight:  Fair  Psychomotor Activity:  Decreased  Concentration:  Fair  Recall:  [employer] of Knowledge:Fair  Language: Fair  Akathisia:  No  Handed:  Right  AIMS (if indicated):     Assets:  Communication Skills Desire for Improvement Physical Health Resilience Social Support  ADL's:  Intact  Cognition: WNL  Sleep:  Number of Hours: 6.45     Current Medications: Current Facility-Administered Medications  Medication Dose Route Frequency Provider Last Rate Last Dose  . acetaminophen (TYLENOL) tablet 650 mg  650 mg Oral Q6H PRN Gonzella Lex, MD      . alum & mag hydroxide-simeth (MAALOX/MYLANTA) 200-200-20  MG/5ML suspension 30 mL  30 mL Oral Q4H PRN Gonzella Lex, MD      . FLUoxetine (PROZAC) capsule 20 mg  20 mg Oral Daily Gonzella Lex, MD   20 mg at 04/13/15 0909  . magnesium hydroxide (MILK OF MAGNESIA) suspension 30 mL  30 mL Oral Daily PRN Gonzella Lex, MD        Lab Results:  Results for orders placed or performed during the hospital encounter of 04/11/15 (from the past 48 hour(s))  CBC WITH DIFFERENTIAL     Status: Abnormal   Collection Time: 04/11/15  3:58 PM  Result Value Ref Range   WBC 10.1 3.8 - 10.6 K/uL   RBC 5.60 4.40 - 5.90 MIL/uL   Hemoglobin 17.3 13.0 - 18.0 g/dL   HCT 53.0 (H)  40.0 - 52.0 %   MCV 94.7 80.0 - 100.0 fL   MCH 31.0 26.0 - 34.0 pg   MCHC 32.7 32.0 - 36.0 g/dL   RDW 14.0 11.5 - 14.5 %   Platelets 203 150 - 440 K/uL   Neutrophils Relative % 75 %   Neutro Abs 7.6 (H) 1.4 - 6.5 K/uL   Lymphocytes Relative 16 %   Lymphs Abs 1.6 1.0 - 3.6 K/uL   Monocytes Relative 7 %   Monocytes Absolute 0.7 0.2 - 1.0 K/uL   Eosinophils Relative 1 %   Eosinophils Absolute 0.1 0 - 0.7 K/uL   Basophils Relative 1 %   Basophils Absolute 0.1 0 - 0.1 K/uL  Comprehensive metabolic panel     Status: Abnormal   Collection Time: 04/11/15  3:58 PM  Result Value Ref Range   Sodium 140 135 - 145 mmol/L   Potassium 3.6 3.5 - 5.1 mmol/L   Chloride 105 101 - 111 mmol/L   CO2 27 22 - 32 mmol/L   Glucose, Bld 161 (H) 65 - 99 mg/dL   BUN 13 6 - 20 mg/dL   Creatinine, Ser 1.15 0.61 - 1.24 mg/dL   Calcium 9.4 8.9 - 10.3 mg/dL   Total Protein 7.9 6.5 - 8.1 g/dL   Albumin 4.2 3.5 - 5.0 g/dL   AST 31 15 - 41 U/L   ALT 33 17 - 63 U/L   Alkaline Phosphatase 79 38 - 126 U/L   Total Bilirubin 0.7 0.3 - 1.2 mg/dL   GFR calc non Af Amer >60 >60 mL/min   GFR calc Af Amer >60 >60 mL/min    Comment: (NOTE) The eGFR has been calculated using the CKD EPI equation. This calculation has not been validated in all clinical situations. eGFR's persistently <60 mL/min signify possible Chronic Kidney Disease.    Anion gap 8 5 - 15  Ethanol     Status: None   Collection Time: 04/11/15  3:58 PM  Result Value Ref Range   Alcohol, Ethyl (B) <5 <5 mg/dL    Comment:        LOWEST DETECTABLE LIMIT FOR SERUM ALCOHOL IS 5 mg/dL FOR MEDICAL PURPOSES ONLY   Urine Drug Screen, Qualitative ([facility] only)     Status: None   Collection Time: 04/11/15  4:15 PM  Result Value Ref Range   Tricyclic, Ur Screen NONE DETECTED NONE DETECTED   Amphetamines, Ur Screen NONE DETECTED NONE DETECTED   MDMA (Ecstasy)Ur Screen NONE DETECTED NONE DETECTED   Cocaine Metabolite,Ur [place] NONE DETECTED NONE DETECTED    Opiate, Ur Screen NONE DETECTED NONE DETECTED   Phencyclidine (PCP) Ur S NONE DETECTED NONE  DETECTED   Cannabinoid 50 Ng, Ur [place] NONE DETECTED NONE DETECTED   Barbiturates, Ur Screen NONE DETECTED NONE DETECTED   Benzodiazepine, Ur Scrn NONE DETECTED NONE DETECTED   Methadone Scn, Ur NONE DETECTED NONE DETECTED    Comment: (NOTE) 341  Tricyclics, urine               Cutoff 1000 ng/mL 200  Amphetamines, urine             Cutoff 1000 ng/mL 300  MDMA (Ecstasy), urine           Cutoff 500 ng/mL 400  Cocaine Metabolite, urine       Cutoff 300 ng/mL 500  Opiate, urine                   Cutoff 300 ng/mL 600  Phencyclidine (PCP), urine      Cutoff 25 ng/mL 700  Cannabinoid, urine              Cutoff 50 ng/mL 800  Barbiturates, urine             Cutoff 200 ng/mL 900  Benzodiazepine, urine           Cutoff 200 ng/mL 1000 Methadone, urine                Cutoff 300 ng/mL 1100 1200 The urine drug screen provides only a preliminary, unconfirmed 1300 analytical test result and should not be used for non-medical 1400 purposes. Clinical consideration and professional judgment should 1500 be applied to any positive drug screen result due to possible 1600 interfering substances. A more specific alternate chemical method 1700 must be used in order to obtain a confirmed analytical result.  1800 Gas chromato graphy / mass spectrometry (GC/MS) is the preferred 1900 confirmatory method.   Urinalysis complete, with microscopic ([facility] only)     Status: Abnormal   Collection Time: 04/11/15  4:15 PM  Result Value Ref Range   Color, Urine YELLOW (A) YELLOW   APPearance CLEAR (A) CLEAR   Glucose, UA NEGATIVE NEGATIVE mg/dL   Bilirubin Urine NEGATIVE NEGATIVE   Ketones, ur NEGATIVE NEGATIVE mg/dL   Specific Gravity, Urine 1.027 1.005 - 1.030   Hgb urine dipstick NEGATIVE NEGATIVE   pH 5.0 5.0 - 8.0   Protein, ur NEGATIVE NEGATIVE mg/dL   Nitrite NEGATIVE NEGATIVE   Leukocytes, UA NEGATIVE NEGATIVE   RBC /  HPF 0-5 0 - 5 RBC/hpf   WBC, UA 0-5 0 - 5 WBC/hpf   Bacteria, UA NONE SEEN NONE SEEN   Squamous Epithelial / LPF NONE SEEN NONE SEEN   Mucous PRESENT     Physical Findings: AIMS: Facial and Oral Movements Muscles of Facial Expression: None, normal Lips and Perioral Area: None, normal Jaw: None, normal Tongue: None, normal,Extremity Movements Upper (arms, wrists, hands, fingers): None, normal Lower (legs, knees, ankles, toes): None, normal, Trunk Movements Neck, shoulders, hips: None, normal, Overall Severity Severity of abnormal movements (highest score from questions above): None, normal Incapacitation due to abnormal movements: None, normal Patient's awareness of abnormal movements (rate only patient's report): No Awareness, Dental Status Current problems with teeth and/or dentures?: No Does patient usually wear dentures?: No  CIWA:    COWS:     Treatment Plan Summary: Daily contact with patient to assess and evaluate symptoms and progress in treatment and Medication management   Medical Decision Making:  Established Problem, Stable/Improving (1), Review of Psycho-Social Stressors (1), Review or order clinical lab tests (1),  Review of Medication Regimen & Side Effects (2) and Review of New Medication or Change in Dosage (2)   Jesse Pearson is a 44 year old male with no past psychiatric history admitted for suicidal ideation with a plan in the context of marital problems.  1. Suicidal ideation. He still feels suicidal. He is able to contract for safety in the hospital. He is on 15 minutes checks.  2. Mood. He was started on Prozac for depression . 3. Insomnia. We will start trazodone for sleep.  4. Productive cough. Will order chest Xray.  5. Disposition. He will be discharged to home he will follow up with one of the local providers. Family meeting would be desirable. I do not think that he is estranged wife would come but may be his sister who is major  support.       Imaya Duffy 04/13/2015, 11:43 AM He reports her is and all right

## 2015-04-13 NOTE — Progress Notes (Signed)
Recreation Therapy Notes  Date: 06.15.16 Time: 3:00 pm Location: Craft Room  Group Topic: Self-esteem, coping skills  Goal Area(s) Addresses:  Patient will identify positive attributes about self. Patient will identify at least one coping skill.  Behavioral Response: Attentive, Interactive  Intervention: All About Me  Activity: Patients were instructed to make an All About Me pamphlet including positive traits, healthy coping skills, and their healthy support system.  Education:LRT educated patients on ways to increase their self-esteem.  Education Outcome: Acknowledges education/In group clarification offered  Clinical Observations/Feedback: Patient completed activity listing positive traits, healthy coping skills, and his healthy support system. Patient contributed to group discussion by stating that it was hard to list positive traits.  Jacquelynn Cree, LRT/CTRS 04/13/2015 4:38 PM

## 2015-04-13 NOTE — Progress Notes (Signed)
Verbalized passive suicidal thoughts,contracts for safety.Rated his depression 7/10.He is cooperative,minimal interaction with peers.Attended groups.Stated that he gets more anxious in the group.Compliant with meds.Appetite fair.

## 2015-04-14 NOTE — Progress Notes (Addendum)
[facility] MD Progress Note  04/14/2015 1:12 PM CHRISTIFER CHAPDELAINE  MRN:  [id number]  Subjective:  Mr. Hurlbut feels slightly better today. He believes that going to groups and learning more about depression which isn't new problem to him is helpful he was started on a combination of Prozac and Abilify. He does not report any unwanted effects. He sleeps well at night without a sleeping aid. He has been talking to his sister who is his major support. He also has been talking to his son who now stays with his estranged wife. Appetite is okay. There are no somatic complaints.  Principal Problem: Severe major depression with psychotic features, mood-congruent Diagnosis:   Patient Active Problem List   Diagnosis Date Noted  . 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 Suicidal ideation [R45.851] 04/11/2015  . Sleep apnea [G47.30] 04/11/2015   Total Time spent with patient: 20 minutes   Past Medical History:  Past Medical History  Diagnosis Date  . Sleep apnea   . Anxiety     Past Surgical History  Procedure Laterality Date  . Appendectomy     Family History: History reviewed. No pertinent family history. Social History:  History  Alcohol Use No     History  Drug Use Not on file    History   Social History  . Marital Status: Married    Spouse Name: N/A  . Number of Children: N/A  . Years of Education: N/A   Social History Main Topics  . Smoking status: Never Smoker   . Smokeless tobacco: Not on file  . Alcohol Use: No  . Drug Use: Not on file  . Sexual Activity: Not on file   Other Topics Concern  . None   Social History Narrative   Additional History:    Sleep: Good  Appetite:  Fair   Assessment:   Musculoskeletal: Strength & Muscle Tone: within normal limits Gait & Station: normal Patient leans: N/A   Psychiatric Specialty Exam: Physical Exam  Nursing note and vitals reviewed.   Review of Systems  All other  systems reviewed and are negative.   Blood pressure 127/83, pulse 105, temperature 98.6 F (37 C), temperature source Oral, resp. rate 20, height 6' (1.829 m), weight 142.883 kg (315 lb), SpO2 95 %.Body mass index is 42.71 kg/(m^2).  General Appearance: Casual  Eye Contact::  Fair  Speech:  Slow  Volume:  Decreased  Mood:  Depressed and Hopeless  Affect:  Constricted  Thought Process:  Goal Directed  Orientation:  Full (Time, Place, and Person)  Thought Content:  WDL  Suicidal Thoughts:  Yes.  without intent/plan  Homicidal Thoughts:  No  Memory:  Immediate;   Fair Recent;   Fair Remote;   Fair  Judgement:  Fair  Insight:  Fair  Psychomotor Activity:  Normal  Concentration:  Fair  Recall:  [employer] of Knowledge:Fair  Language: Fair  Akathisia:  No  Handed:  Right  AIMS (if indicated):     Assets:  Desire for Improvement Financial Resources/Insurance Housing Physical Health Resilience Social Support  ADL's:  Intact  Cognition: WNL  Sleep:  Number of Hours: 7     Current Medications: Current Facility-Administered Medications  Medication Dose Route Frequency Provider Last Rate Last Dose  . acetaminophen (TYLENOL) tablet 650 mg  650 mg Oral Q6H PRN Audery Amel, MD      . alum & mag hydroxide-simeth (MAALOX/MYLANTA) 200-200-20 MG/5ML suspension 30 mL  30  mL Oral Q4H PRN Audery Amel, MD      . ARIPiprazole (ABILIFY) tablet 2 mg  2 mg Oral Daily Jonathan Corpus B Charnese Federici, MD   2 mg at 04/14/15 1006  . FLUoxetine (PROZAC) capsule 20 mg  20 mg Oral Daily Audery Amel, MD   20 mg at 04/14/15 1006  . magnesium hydroxide (MILK OF MAGNESIA) suspension 30 mL  30 mL Oral Daily PRN Audery Amel, MD        Lab Results: No results found for this or any previous visit (from the past 48 hour(s)).  Physical Findings: AIMS: Facial and Oral Movements Muscles of Facial Expression: None, normal Lips and Perioral Area: None, normal Jaw: None, normal Tongue: None,  normal,Extremity Movements Upper (arms, wrists, hands, fingers): None, normal Lower (legs, knees, ankles, toes): None, normal, Trunk Movements Neck, shoulders, hips: None, normal, Overall Severity Severity of abnormal movements (highest score from questions above): None, normal Incapacitation due to abnormal movements: None, normal Patient's awareness of abnormal movements (rate only patient's report): No Awareness, Dental Status Current problems with teeth and/or dentures?: No Does patient usually wear dentures?: No  CIWA:    COWS:     Treatment Plan Summary: Daily contact with patient to assess and evaluate symptoms and progress in treatment and Medication management   Medical Decision Making:  Established Problem, Stable/Improving (1), Review of Psycho-Social Stressors (1), Review or order clinical lab tests (1), Review of Medication Regimen & Side Effects (2) and Review of New Medication or Change in Dosage (2)   Mr. Paladino is a 44 year old male with no past psychiatric history admitted for suicidal ideation with a plan in the context of marital problems.  1. Suicidal ideation. He still feels suicidal. He is able to contract for safety in the hospital. He is on 15 minutes checks.  2. Mood. He was started on Prozac and Abilify for depression . 3. Insomnia. We will start trazodone for sleep.  4. Productive cough. Will order chest Xray.  5. Good active cough. Chest x-ray is negative.   6. Disposition. He will be discharged to home he will follow up with one of the local providers. Family meeting would be desirable. I do not think that he is estranged wife would come but may be his sister who is major support.      Xavyer Steenson 04/14/2015, 1:12 PM

## 2015-04-14 NOTE — Plan of Care (Signed)
Problem: Diagnosis: Increased Risk For Suicide Attempt Goal: STG-Patient Will Attend All Groups On The Unit Outcome: Progressing Patient has attended most groups today.

## 2015-04-14 NOTE — [facility] Counselor (Signed)
Adult Comprehensive Assessment  Patient ID: Jesse Pearson, male   DOB: Oct 17, 1971, 44 y.o.   MRN: [id number]  Information Source: Information source: Patient  Current Stressors:  Family Relationships: Wife is filing for divorce Financial / Lack of resources (include bankruptcy): stress related to managing finances for mother Social relationships: limited Bereavement / Loss: Father died approximately 10 years ago  Living/Environment/Situation:  Living Arrangements: Children (79 y/o son) How long has patient lived in current situation?: wife moved fall of 2015 to care for her mother What is atmosphere in current home:  (unsettled due to separation)  Family History:  Marital status: Separated Separated, when?: officially in April 2016 What types of issues is patient dealing with in the relationship?: Pt has been married for 21 years.  He stated he spent his life trying to make his wife happy.  "She was my everything" Does patient have children?: Yes How many children?: 1 How is patient's relationship with their children?: close  Childhood History:  By whom was/is the patient raised?: Both parents Description of patient's relationship with caregiver when they were a child: good Patient's description of current relationship with people who raised him/her: mother is a nursing facility with dementia Does patient have siblings?: Yes Number of Siblings: 2 Description of patient's current relationship with siblings: 2 step sisters (pt and sisters have the same mother) Did patient suffer any verbal/emotional/physical/sexual abuse as a child?: No Did patient suffer from severe childhood neglect?: No Has patient ever been sexually abused/assaulted/raped as an adolescent or adult?: No Was the patient ever a victim of a crime or a disaster?: No Witnessed domestic violence?: No Has patient been effected by domestic violence as an adult?: No  Education:  Highest grade of school patient  has completed: 12th Currently a student?: No Learning disability?: No  Employment/Work Situation:   Employment situation: Employed 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, Lobbyist How long has patient been employed?: 25 yrs Patient's job has been impacted by current illness: No What is the longest time patient has a held a job?: 25 yrs Where was the patient employed at that time?: same Has patient ever been in the [employer]?: No Has patient ever served in Buyer, retail?: No  Financial Resources:   Financial resources: Income from employment, Private insurance Does patient have a representative payee or guardian?: No  Alcohol/Substance Abuse:   What has been your use of drugs/alcohol within the last 12 months?: none If attempted suicide, did drugs/alcohol play a role in this?: No Alcohol/Substance Abuse Treatment Hx: Denies past history Has alcohol/substance abuse ever caused legal problems?: No  Social Support System:   Conservation officer, nature Support System: Fair Development worker, community Support System: sisters Type of faith/religion: [employer]ri[employer] Knuckles How does patient's faith help to cope with current illness?: provide hope  Leisure/Recreation:   Leisure and Hobbies: fishing  Strengths/Needs:   What things does the patient do well?: work In what areas does patient struggle / problems for patient: limited social supports  Discharge Plan:   Does patient have access to transportation?: Yes Will patient be returning to same living situation after discharge?: Yes Currently receiving community mental health services: No If no, would patient like referral for services when discharged?: Yes (What [place] Does patient have financial barriers related to discharge medications?: No  Summary/Recommendations:   Pt is a 44 y/o separated male who currently lives in a private residence with his 65 y/o son.  Pt was admitted due to severe depression and SI.  Pt denies SA.  Pt explained  that his wife stated she wanted a divorce and nothing will change her mind.  Pt is grieving the loss of his marriage.  Pt stated his suicidal ideations were increasing and he realized he needed to get help.  Pt stated he was thinking about burning down the house while he was inside on a day this his son was with his mother or wrecking his vehicle.  Pt denies history of outpatient therapy but is interested in a referral to outpatient therapy and psychiatrist post discharge.  Pt would also be a good candidate for PHP/IOP.  Pt is encouraged to participate in the treatment process, group therapy, medication management and discharge planning.  [place], LCSW [phone] [place], Lavonne Chick D. 04/14/2015

## 2015-04-14 NOTE — Progress Notes (Signed)
Patient states he is still very depressed. Says he is having AH that tell him he is worthless. Reports intermittent SI but no plan. Contracts for safety. Denies pain. Attending some groups. Cooperative with meds.

## 2015-04-14 NOTE — [facility] Group Notes (Signed)
[facility] LCSW Group Therapy  04/14/2015 4:35 PM  Type of Therapy:  Group Therapy  Participation Level:  Did Not Attend  Participation Quality:  Drowsy  Affect:  Appropriate  Cognitive:  Appropriate  Insight:  Lacking  Engagement in Therapy:  Lacking  Modes of Intervention:  Education, Exploration and Support  Summary of Progress/Problems:patient was an active participant but remains very depressed. He struggles with trusting others and still reflects on past relationship. He will try to move on  Jesse Pearson M 04/14/2015, 4:35 PM Group Notes - Nursing/MHT/Case Manager/Adjunct                    Cheron Schaumann 04/14/2015 4:34 PM

## 2015-04-14 NOTE — [facility] Group Notes (Signed)
[facility] LCSW Aftercare Discharge Planning Group Note  04/14/2015 8:15 AM  Participation Quality:  Appropriate  Affect:  Appropriate  Cognitive:  Appropriate  Insight:  Developing/Improving  Engagement in Group:  Developing/Improving  Modes of Intervention:  Discussion, Education and Support  Summary of Progress/Problems:patient plans to work on self care,medications, see psychiatrist and attend groups  Johnella Moloney, Maya Arcand M 04/14/2015, 8:15 AM

## 2015-04-14 NOTE — Progress Notes (Signed)
Alert and oriented x 4, affect is flat but brightens upon approach denies pain or discomfort, interacting appropriately with peers and staff and attending unit program. 15 minutes  checks maintained.

## 2015-04-14 NOTE — Plan of Care (Signed)
Problem: Alteration in mood Goal: LTG-Pt's behavior demonstrates decreased signs of depression (Patient's behavior demonstrates decreased signs of depression to the point the patient is safe to return home and continue treatment in an outpatient setting)  Outcome: Progressing Interacting with peers and attending group.

## 2015-04-14 NOTE — [facility] Group Notes (Signed)
[facility] Group Notes:  (Nursing/MHT/Case Management/Adjunct)  Date:  04/14/2015  Time:  10:16 AM  Type of Therapy:  goal   Participation Level:  Did Not Attend   Jesse Pearson Lea Campbell 04/14/2015, 10:16 AM 

## 2015-04-14 NOTE — Progress Notes (Signed)
Recreation Therapy Notes  Date: 06.16.16 Time: 3:05 pm Location: Craft Room  Group Topic: Coping Skills/Leisure Education  Goal Area(s) Addresses:  Patient will identify things they are grateful for. Patient will identify how being grateful can influence your decision making.  Behavioral Response: Attentive, Interactive  Intervention: Grateful Wheel  Activity: Patients were given an "I Am Grateful For" worksheet and instructed to list at least one thing they were grateful for under each category.   Education: LRT educated patient on leisure and why it is important to implement it into their schedules.  Education Outcome: Acknowledges education/In group clarification offered  Clinical Observations/Feedback: Patient participated in group activity. Patient contributed to group discussion by stating things he was grateful for.  Jacquelynn Cree, LRT/CTRS 04/14/2015 4:54 PM

## 2015-04-14 NOTE — [facility] Group Notes (Signed)
[facility] Group Notes:  (Nursing/MHT/Case Management/Adjunct)  Date:  04/14/2015  Time:  2:04 PM  Type of Therapy:  Movement Therapy  Participation Level:  Did Not Attend  Summary of Progress/Problems:  Jesse Pearson Jesse Pearson 04/14/2015, 2:04 PM

## 2015-04-14 NOTE — [facility] Group Notes (Signed)
[facility] Group Notes:  (Nursing/MHT/Case Management/Adjunct)  Date:  04/14/2015  Time:  1:18 AM  Type of Therapy:  Group Therapy  Participation Level:  Active  Participation Quality:  Appropriate  Affect:  Appropriate  Cognitive:  Appropriate  Insight:  Appropriate  Engagement in Group:  Engaged  Modes of Intervention:  n/a  Summary of Progress/Problems:  Jesse Pearson 04/14/2015, 1:18 AM

## 2015-04-14 NOTE — Plan of Care (Signed)
Problem: [facility] Participation in Recreation Therapeutic Interventions Goal: STG-Patient will demonstrate improved self esteem by identif STG: Self-Esteem - Within 5 treatment sessions, patient will verbalize at least 5 positive affirmation statements in each of 3 treatment sessions to increase self-esteem post d/c.  Outcome: Progressing Treatment Session 2; Completed 2 out of 3: At approximately 9:55 am, LRT met with patient in craft room. Patient verbalized 5 positive affirmation statements. Patient reported it felt "okay". LRT encouraged patient to continue saying positive affirmation statements.  Leonette Monarch, LRT/CTRS 06.16.16 1:21 pm Goal: STG-Other Recreation Therapy Goal (Specify) STG: Stress Management - Within 5 treatment sessions, patient will demonstrate at least one stress management technique in each of 2 treatment sessions to increase stress management skills post d/c.  Outcome: Progressing Treatment Session 2; Completed 1 out of 2: At approximately 9:55 am, LRT met with patient in craft room. Patient reported he read over and practiced some of the stress management techniques. Patient demonstrated one stress management technique. LRT encouraged patient to read over and practice the stress management techniques.  Leonette Monarch, LRT/CTRS 06.16.16 1:23 pm

## 2015-04-14 NOTE — [facility] Group Notes (Signed)
[facility] LCSW Group Therapy  04/14/2015 8:12 AM  Type of Therapy:  Group Therapy  Participation Level:  Active  Participation Quality:  Attentive  Affect:  Appropriate  Cognitive:  Appropriate  Insight:  Developing/Improving  Engagement in Therapy:  Developing/Improving  Modes of Intervention:  Discussion, Education, Exploration and Support  Summary of Progress/Problems:LCSW introduced group rules today's topic was Balance in life and relapse prevention. Patients were asked to reflect on challenges they have once in the community and barriers to their success. Patients were then asked in a supportive collaboration to come up with ideas that could support overcoming their barriers and challenges. Patient reported he is really struggling about his non-relationship with his wife. Several peers supported pt and LCSW offered to support patient after group. He is attempting to find out WHY his relationship with his wife is over. Several positive I statements were reviewed with patient. He will try to move forward in the future.  Jesse Pearson M 04/14/2015, 8:12 AM

## 2015-04-15 MED ORDER — ARIPIPRAZOLE 5 MG PO TABS
5.0000 mg | ORAL_TABLET | Freq: Every day | ORAL | Status: DC
  Administered 2015-04-16 – 2015-04-22 (×7): 5 mg via ORAL
  Filled 2015-04-15 (×7): qty 1

## 2015-04-15 NOTE — [facility] Group Notes (Signed)
[facility] Group Notes:  (Nursing/MHT/Case Management/Adjunct)  Date:  04/15/2015  Time:  1:28 AM  Type of Therapy:  Group Therapy  Participation Level:  Active  Participation Quality:  Appropriate  Affect:  Appropriate  Cognitive:  Appropriate  Insight:  Good  Engagement in Group:  Engaged  Modes of Intervention:  n/a  Summary of Progress/Problems:  Jesse Pearson 04/15/2015, 1:28 AM

## 2015-04-15 NOTE — [facility] Group Notes (Signed)
[facility] LCSW Group Therapy  04/15/2015 5:05 PM  Type of Therapy:  Group Therapy  Participation Level:  Active  Participation Quality:  Attentive  Affect:  Appropriate  Cognitive:  Oriented  Insight:  Developing/Improving  Engagement in Therapy:  Developing/Improving  Modes of Intervention:  Discussion, Exploration, Socialization and Support  Summary of Progress/Problems: LCSW reviewed group rules with each patient. It was a calm small group and we mad our focus on things we can do to promote our wellness, happiness and relationship obstacles. Patients were asked to share and reflect on personal dating experiences and then support one another with positive affirmations and funny life quotes.   Arrie Senate M 04/15/2015, 5:05 PM

## 2015-04-15 NOTE — [facility] Group Notes (Signed)
[facility] LCSW Aftercare Discharge Planning Group Note  04/15/2015 11:10 AM  Participation Quality:  Attentive  Affect:  Appropriate  Cognitive:  Appropriate  Insight:  Developing/Improving  Engagement in Group:  Developing/Improving  Modes of Intervention:  Discussion, Education and Support  Summary of Progress/Problems:patients smart goal for today is to reduce/replace negative thoughts with positive thoughts  Jesse Pearson M 04/15/2015, 11:10 AM

## 2015-04-15 NOTE — [facility] Group Notes (Signed)
[facility] Group Notes:  (Nursing/MHT/Case Management/Adjunct)  Date:  04/15/2015  Time:  12:00 PM  Type of Therapy:  Group Therapy  Participation Level:  Active  Participation Quality:  Appropriate and Sharing  Affect:  Appropriate, Depressed and Flat  Cognitive:  Appropriate and Oriented  Insight:  Appropriate and Good  Engagement in Group:  Engaged  Modes of Intervention:  Discussion, Education, Exploration and Support  Summary of Progress/Problems:  Jesse Pearson 04/15/2015, 12:00 PM

## 2015-04-15 NOTE — Plan of Care (Signed)
Problem: [facility] Participation in Recreation Therapeutic Interventions Goal: STG-Patient will demonstrate improved self esteem by identif STG: Self-Esteem - Within 5 treatment sessions, patient will verbalize at least 5 positive affirmation statements in each of 3 treatment sessions to increase self-esteem post d/c.  Outcome: Completed/Met Date Met:  04/15/15 Treatment Session 3; Completed 3 out of 3: At approximately 11:30 am, LRT met with patient in hallway. Patient verbalized 5 positive affirmation statements. Patient reported it felt "okay" and "maybe a little bit better". LRT encouraged patient to continue saying positive affirmation statements.  Leonette Monarch, LRT/CTRS 06.17.16 1:24 pm Goal: STG-Other Recreation Therapy Goal (Specify) STG: Stress Management - Within 5 treatment sessions, patient will demonstrate at least one stress management technique in each of 2 treatment sessions to increase stress management skills post d/c.  Outcome: Completed/Met Date Met:  04/15/15 Treatment Session 3; Completed 2 out of 2: At approximately 11:30 am, LRT met with patient in hallway. Patient demonstrated one stress management technique. LRT encouraged patient to continue practicing the stress management techniques.  Leonette Monarch, LRT/CTRS 06.17.16 1:26 pm

## 2015-04-15 NOTE — Progress Notes (Signed)
Pt visible on the unit. Quiet. Attended group. Pleasant. Voiced no complaints.

## 2015-04-15 NOTE — Progress Notes (Signed)
Recreation Therapy Notes  Date: 06.17.16 Time: 3:00 pm Location: Craft Room  Group Topic: Self-expression/coping skills  Goal Area(s) Addresses:  Patient will effectively use art as a means of self-expression. Patient recognize positive benefit for self-expression. Patient will be able to identify one emotion experienced during group session. Patient will identify use of art/self-expression as a coping skill.  Behavioral Response: Attentive  Intervention: Two Faces of Me  Activity: Patients were given a blank face worksheet and instructed to draw or write how they felt when they were admitted to the hospital on one said and draw or write how they want to feel when they are d/c on the other side.  Education: LRT educated patients on different forms of self-expression.   Education Outcome: Acknowledges education/In group clarification offered  Clinical Observations/Feedback: Patient completed activity by writing how he felt when he was admitted and how he wants to feel when he is d/c. Patient did not contribute to group discussion.  Jacquelynn Cree, LRT/CTRS 04/15/2015 4:18 PM

## 2015-04-15 NOTE — Progress Notes (Signed)
His mood is depressed,brightens up on approach.Minimal interaction with staff & peers.He is positive for hallucination & denies suicidal & homicidal ideation.Attended groups.Compliant with meds.Appetite good.

## 2015-04-15 NOTE — [facility] Group Notes (Signed)
Adult Psychoeducational Group Note  Date:  04/15/2015 Time:  11:28 PM  Group Topic/Focus:  Wrap-Up Group:   The focus of this group is to help patients review their daily goal of treatment and discuss progress on daily workbooks.  Participation Level:  Minimal  Participation Quality:  Appropriate  Affect:  Flat  Cognitive:  Lacking  Insight: Limited  Engagement in Group:  Limited  Modes of Intervention:  Discussion  Additional Comments:  N/A  Jesse Pearson 04/15/2015, 11:28 PM

## 2015-04-15 NOTE — Progress Notes (Signed)
[facility] MD Progress Note  04/15/2015 1:33 PM LAKENDRIC PAPALIA  MRN:  [id number]  Subjective: Mr. Qian disclosed now that he has auditory hallucinations that are derogatory in nature. He has been hearing voices for the past 2 months. He denies command hallucinations. He was started on Abilify couple of days ago for treatment of depression rather than to address psychotic symptoms that we were unaware of. We will increase Abilify. His mood is slightly better, affect is brighter. He participates well in groups. Sleep and appetite are good. He denies symptoms of anxiety. He feels safe in the hospital denies paranoia. He tolerates medications well as far.  Principal Problem: Severe major depression with psychotic features, mood-congruent Diagnosis:   Patient Active Problem List   Diagnosis Date Noted  . 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 Suicidal ideation [R45.851] 04/11/2015  . Sleep apnea [G47.30] 04/11/2015   Total Time spent with patient: 20 minutes   Past Medical History:  Past Medical History  Diagnosis Date  . Sleep apnea   . Anxiety     Past Surgical History  Procedure Laterality Date  . Appendectomy     Family History: History reviewed. No pertinent family history. Social History:  History  Alcohol Use No     History  Drug Use Not on file    History   Social History  . Marital Status: Married    Spouse Name: N/A  . Number of Children: N/A  . Years of Education: N/A   Social History Main Topics  . Smoking status: Never Smoker   . Smokeless tobacco: Not on file  . Alcohol Use: No  . Drug Use: Not on file  . Sexual Activity: Not on file   Other Topics Concern  . None   Social History Narrative   Additional History:    Sleep: Good  Appetite:  Good   Assessment:   Musculoskeletal: Strength & Muscle Tone: within normal limits Gait & Station: normal Patient leans: N/A   Psychiatric Specialty  Exam: Physical Exam  Nursing note and vitals reviewed.   Review of Systems  All other systems reviewed and are negative.   Blood pressure 141/92, pulse 109, temperature 98.6 F (37 C), temperature source Oral, resp. rate 20, height 6' (1.829 m), weight 142.883 kg (315 lb), SpO2 95 %.Body mass index is 42.71 kg/(m^2).  General Appearance: Casual  Eye Contact::  Fair  Speech:  Slow  Volume:  Decreased  Mood:  Depressed  Affect:  Restricted  Thought Process:  Goal Directed and Linear  Orientation:  Full (Time, Place, and Person)  Thought Content:  Hallucinations: Auditory  Suicidal Thoughts:  No  Homicidal Thoughts:  No  Memory:  Immediate;   Fair Recent;   Fair Remote;   Fair  Judgement:  Fair  Insight:  Fair  Psychomotor Activity:  Normal  Concentration:  Fair  Recall:  [employer] of Knowledge:Fair  Language: Fair  Akathisia:  No  Handed:  Right  AIMS (if indicated):     Assets:  Communication Skills Desire for Improvement Financial Resources/Insurance Housing Physical Health Social Support  ADL's:  Intact  Cognition: WNL  Sleep:  Number of Hours: 6.75     Current Medications: Current Facility-Administered Medications  Medication Dose Route Frequency Provider Last Rate Last Dose  . acetaminophen (TYLENOL) tablet 650 mg  650 mg Oral Q6H PRN Audery Amel, MD      . alum & mag hydroxide-simeth (MAALOX/MYLANTA) 200-200-20 MG/5ML  suspension 30 mL  30 mL Oral Q4H PRN Audery Amel, MD      . ARIPiprazole (ABILIFY) tablet 2 mg  2 mg Oral Daily Shari Prows, MD   2 mg at 04/15/15 0910  . FLUoxetine (PROZAC) capsule 20 mg  20 mg Oral Daily Audery Amel, MD   20 mg at 04/15/15 0910  . magnesium hydroxide (MILK OF MAGNESIA) suspension 30 mL  30 mL Oral Daily PRN Audery Amel, MD        Lab Results: No results found for this or any previous visit (from the past 48 hour(s)).  Physical Findings: AIMS: Facial and Oral Movements Muscles of Facial Expression:  None, normal Lips and Perioral Area: None, normal Jaw: None, normal Tongue: None, normal,Extremity Movements Upper (arms, wrists, hands, fingers): None, normal Lower (legs, knees, ankles, toes): None, normal, Trunk Movements Neck, shoulders, hips: None, normal, Overall Severity Severity of abnormal movements (highest score from questions above): None, normal Incapacitation due to abnormal movements: None, normal Patient's awareness of abnormal movements (rate only patient's report): No Awareness, Dental Status Current problems with teeth and/or dentures?: No Does patient usually wear dentures?: No  CIWA:    COWS:     Treatment Plan Summary: Daily contact with patient to assess and evaluate symptoms and progress in treatment and Medication management   Medical Decision Making:  Established Problem, Stable/Improving (1), Review of Psycho-Social Stressors (1), Review or order clinical lab tests (1), Review of Medication Regimen & Side Effects (2) and Review of New Medication or Change in Dosage (2)   Mr. Demps is a 44 year old male with no past psychiatric history admitted for suicidal ideation with a plan in the context of marital problems.  1. Suicidal ideation. He still feels suicidal. He is able to contract for safety in the hospital. He is on 15 minutes checks.  2. Mood. He was started on Prozac and Abilify for depression. Will increase Abilify to 5 mg daily to address hallucinations. . 3. Insomnia. He experiences no sleeping problems in the hospital   4. Productive cough. Chest Xray negative.  5. Disposition. He will be discharged to home he will follow up with one of the local providers. Family meeting would be desirable. I do not think that he is estranged wife would come but may be his sister who is major support     Pennye Beeghly 04/15/2015, 1:33 PM

## 2015-04-15 NOTE — Progress Notes (Signed)
Nutrition Follow-up  INTERVENTION:  Meals and Snacks: Cater to patient preferences Coordination of Care: recommend rechecking weight when able   NUTRITION DIAGNOSIS:  Inadequate oral intake related to acute illness as evidenced by percent weight loss ongoing  GOAL:  Patient will meet greater than or equal to 90% of their needs;currently meeting  MONITOR:   (Energy intake)  ASSESSMENT:  Per MD note, pt now disclosing auditory hallucinations, pt remains suicidal.   Current Nutrition: po intake >90% of meals since initial assessment  Medications: reviewed  Anthropometrics: no new weight  Diet Order:  Diet regular Room service appropriate?: Yes; Fluid consistency:: Thin  EDUCATION NEEDS:  No education needs identified at this time  LOW Care Level  Leda Quail, RD, LDN Pager [phone]

## 2015-04-15 NOTE — Plan of Care (Signed)
Problem: Alteration in mood Goal: STG-Patient reports thoughts of self-harm to staff Outcome: Progressing Patient denies suicidal ideation.     

## 2015-04-16 NOTE — Plan of Care (Signed)
Problem: Diagnosis: Increased Risk For Suicide Attempt Goal: STG-Patient Will Attend All Groups On The Unit Outcome: Progressing Patient attends groups on the unit Goal: STG-Patient Will Report Suicidal Feelings to Staff Outcome: Progressing Patient denies SI at this time.

## 2015-04-16 NOTE — Progress Notes (Signed)
Pt was visible in the milieu during the latter part of the evening. Calm and cooperative. No attempts to harm self or others. No s/s of distress. Slept well during the night.

## 2015-04-16 NOTE — Progress Notes (Signed)
[facility] MD Progress Note  04/16/2015 2:06 PM EDMON MAGID  MRN:  [id number]  Subjective: Mr. Rinck disclosed now that he has auditory hallucinations that are derogatory in nature. He has been hearing voices for the past 2 months. He denies command hallucinations. He was started on Abilify couple of days ago for treatment of depression rather than to address psychotic symptoms that we were unaware of. We will increase Abilify. His mood is slightly better, affect is brighter. He participates well in groups. Sleep and appetite are good. He denies symptoms of anxiety. He feels safe in the hospital denies paranoia. He tolerates medications well as far.  As of today, the 18th, the patient states that he is still feeling depressed and having hallucinations. He stays out of his bed and participates in group activities. He says he feels more comfortable that way. He has some passive suicidal thoughts with no intent or plan. Vital signs stable. Behavior calm. No other new problems arriving  Principal Problem: Severe major depression with psychotic features, mood-congruent Diagnosis:   Patient Active Problem List   Diagnosis Date Noted  . 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 Suicidal ideation [R45.851] 04/11/2015  . Sleep apnea [G47.30] 04/11/2015   Total Time spent with patient: 20 minutes   Past Medical History:  Past Medical History  Diagnosis Date  . Sleep apnea   . Anxiety     Past Surgical History  Procedure Laterality Date  . Appendectomy     Family History: History reviewed. No pertinent family history. Social History:  History  Alcohol Use No     History  Drug Use Not on file    History   Social History  . Marital Status: Married    Spouse Name: N/A  . Number of Children: N/A  . Years of Education: N/A   Social History Main Topics  . Smoking status: Never Smoker   . Smokeless tobacco: Not on file  . Alcohol Use: No   . Drug Use: Not on file  . Sexual Activity: Not on file   Other Topics Concern  . None   Social History Narrative   Additional History:    Sleep: Good  Appetite:  Good   Assessment:   Musculoskeletal: Strength & Muscle Tone: within normal limits Gait & Station: normal Patient leans: N/A   Psychiatric Specialty Exam: Physical Exam  Nursing note and vitals reviewed. Constitutional: He appears well-developed and well-nourished.  HENT:  Head: Normocephalic and atraumatic.  Eyes: Conjunctivae are normal. Pupils are equal, round, and reactive to light.  Neck: Normal range of motion.  Cardiovascular: Normal heart sounds.   Respiratory: Effort normal.  GI: Soft.  Musculoskeletal: Normal range of motion.  Neurological: He is alert.  Skin: Skin is warm and dry. Rash noted. There is erythema.  Psychiatric: His speech is normal and behavior is normal. Judgment and thought content normal. His mood appears anxious. Cognition and memory are normal. He exhibits a depressed mood.    Review of Systems  Constitutional: Negative.   HENT: Negative.   Eyes: Negative.   Respiratory: Negative.   Cardiovascular: Negative.   Gastrointestinal: Negative.   Musculoskeletal: Negative.   Skin: Negative.   Neurological: Negative.   Psychiatric/Behavioral: Positive for depression, suicidal ideas and hallucinations. Negative for substance abuse. The patient is nervous/anxious and has insomnia.   All other systems reviewed and are negative.   Blood pressure 128/83, pulse 98, temperature 98.6 F (37 C), temperature source Oral,  resp. rate 20, height 6' (1.829 m), weight 142.883 kg (315 lb), SpO2 95 %.Body mass index is 42.71 kg/(m^2).  General Appearance: Casual  Eye Contact::  Fair  Speech:  Slow  Volume:  Decreased  Mood:  Depressed  Affect:  Restricted  Thought Process:  Goal Directed and Linear  Orientation:  Full (Time, Place, and Person)  Thought Content:  Hallucinations: Auditory   Suicidal Thoughts:  No  Homicidal Thoughts:  No  Memory:  Immediate;   Fair Recent;   Fair Remote;   Fair  Judgement:  Fair  Insight:  Fair  Psychomotor Activity:  Normal  Concentration:  Fair  Recall:  [employer] of Knowledge:Fair  Language: Fair  Akathisia:  No  Handed:  Right  AIMS (if indicated):     Assets:  Communication Skills Desire for Improvement Financial Resources/Insurance Housing Physical Health Social Support  ADL's:  Intact  Cognition: WNL  Sleep:  Number of Hours: 5.5     Current Medications: Current Facility-Administered Medications  Medication Dose Route Frequency Provider Last Rate Last Dose  . acetaminophen (TYLENOL) tablet 650 mg  650 mg Oral Q6H PRN Audery Amel, MD      . alum & mag hydroxide-simeth (MAALOX/MYLANTA) 200-200-20 MG/5ML suspension 30 mL  30 mL Oral Q4H PRN Audery Amel, MD      . ARIPiprazole (ABILIFY) tablet 5 mg  5 mg Oral Daily Jolanta B Pucilowska, MD   5 mg at 04/16/15 1006  . FLUoxetine (PROZAC) capsule 20 mg  20 mg Oral Daily Audery Amel, MD   20 mg at 04/16/15 1006  . magnesium hydroxide (MILK OF MAGNESIA) suspension 30 mL  30 mL Oral Daily PRN Audery Amel, MD        Lab Results: No results found for this or any previous visit (from the past 48 hour(s)).  Physical Findings: AIMS: Facial and Oral Movements Muscles of Facial Expression: None, normal Lips and Perioral Area: None, normal Jaw: None, normal Tongue: None, normal,Extremity Movements Upper (arms, wrists, hands, fingers): None, normal Lower (legs, knees, ankles, toes): None, normal, Trunk Movements Neck, shoulders, hips: None, normal, Overall Severity Severity of abnormal movements (highest score from questions above): None, normal Incapacitation due to abnormal movements: None, normal Patient's awareness of abnormal movements (rate only patient's report): No Awareness, Dental Status Current problems with teeth and/or dentures?: No Does patient  usually wear dentures?: No  CIWA:    COWS:     Treatment Plan Summary: Daily contact with patient to assess and evaluate symptoms and progress in treatment and Medication management   Medical Decision Making:  Established Problem, Stable/Improving (1), Review of Psycho-Social Stressors (1), Review or order clinical lab tests (1), Review of Medication Regimen & Side Effects (2) and Review of New Medication or Change in Dosage (2)   Mr. Tash is a 44 year old male with no past psychiatric history admitted for suicidal ideation with a plan in the context of marital problems.  1. Suicidal ideation. He still feels suicidal. He is able to contract for safety in the hospital. He is on 15 minutes checks.  2. Mood. He was started on Prozac and Abilify for depression. Will increase Abilify to 5 mg daily to address hallucinations. . 3. Insomnia. He experiences no sleeping problems in the hospital   4. Productive cough. Chest Xray negative.  5. Disposition. He will be discharged to home he will follow up with one of the local providers. Family meeting would be desirable. I  do not think that he is estranged wife would come but may be his sister who is major support As of today the patient is still showing suicidal thoughts hallucinations and depression. He is cooperative with treatment. Tolerating treatment without any complaints. Continue current dose of Prozac and Abilify. Continue group and individual therapy. Supportive counseling completed.    Chaniyah Jahr 04/16/2015, 2:06 PM

## 2015-04-17 MED ORDER — HYDROCORTISONE 1 % EX CREA
TOPICAL_CREAM | Freq: Two times a day (BID) | CUTANEOUS | Status: DC
  Administered 2015-04-17 – 2015-04-19 (×4): via TOPICAL
  Filled 2015-04-17: qty 28

## 2015-04-17 NOTE — [facility] Group Notes (Signed)
[facility] LCSW Group Therapy  04/17/2015 3:00 PM  Type of Therapy:  Group Therapy  Participation Level:  Minimal  Participation Quality:  Attentive  Affect:  Depressed  Cognitive:  Alert  Insight:  Limited  Engagement in Therapy:  Limited  Modes of Intervention:  Discussion, Education, Problem-solving, Socialization and Support  Summary of Progress/Problems:Balance in life: Patients will discuss the concept of balance and how it looks and feels to be unbalanced. Pt will identify areas in their life that is unbalanced and ways to become more balanced. Jesse Pearson attended group and stayed the entire time. He sat quietly and listened to other group memeers.    Jesse Pearson, MSW, LCSWA 04/17/2015, 3:00 PM

## 2015-04-17 NOTE — Progress Notes (Signed)
Pt visible on the unit. Interacting with peers watching the ballgame. Med compliant. Denies suicidal thoughts at this time. Continue with 15 min checks. Maintain safe environment.

## 2015-04-17 NOTE — Progress Notes (Signed)
D: Pt denies SI/HI/AV. Pt is pleasant and cooperative. Patient goal was to interact with his peers and to take a shower. A: Pt was offered support and encouragement. Pt was given scheduled medications. Pt was encourage to attend groups. Q 15 minute checks were done for safety.  R:Pt attends groups and interacts well with peers and staff. Pt is taking medication. Pt has no complaints.Pt receptive to treatment and safety maintained on unit.

## 2015-04-17 NOTE — Progress Notes (Signed)
[facility] MD Progress Note  04/17/2015 2:36 PM Jesse Pearson  MRN:  [id number]  Subjective: Jesse Pearson disclosed now that he has auditory hallucinations that are derogatory in nature. He has been hearing voices for the past 2 months. He denies command hallucinations. He was started on Abilify couple of days ago for treatment of depression rather than to address psychotic symptoms that we were unaware of. We will increase Abilify. His mood is slightly better, affect is brighter. He participates well in groups. Sleep and appetite are good. He denies symptoms of anxiety. He feels safe in the hospital denies paranoia. He tolerates medications well as far.  Mood as of the 19th is a little bit better. Feels more hopeful. Tolerating medication well. Still has passive suicidal thoughts at times. Not having active hallucinations. He has no new complaints. He is going to groups and interacting appropriately and talking with staff. A little bit more energetic. Principal Problem: Severe major depression with psychotic features, mood-congruent Diagnosis:   Patient Active Problem List   Diagnosis Date Noted  . 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 Suicidal ideation [R45.851] 04/11/2015  . Sleep apnea [G47.30] 04/11/2015   Total Time spent with patient: 20 minutes   Past Medical History:  Past Medical History  Diagnosis Date  . Sleep apnea   . Anxiety     Past Surgical History  Procedure Laterality Date  . Appendectomy     Family History: History reviewed. No pertinent family history. Social History:  History  Alcohol Use No     History  Drug Use Not on file    History   Social History  . Marital Status: Married    Spouse Name: N/A  . Number of Children: N/A  . Years of Education: N/A   Social History Main Topics  . Smoking status: Never Smoker   . Smokeless tobacco: Not on file  . Alcohol Use: No  . Drug Use: Not on file  . Sexual  Activity: Not on file   Other Topics Concern  . None   Social History Narrative   Additional History:    Sleep: Good  Appetite:  Good   Assessment:   Musculoskeletal: Strength & Muscle Tone: within normal limits Gait & Station: normal Patient leans: N/A   Psychiatric Specialty Exam: Physical Exam  Nursing note and vitals reviewed. Constitutional: He appears well-developed and well-nourished.  HENT:  Head: Normocephalic and atraumatic.  Eyes: Conjunctivae are normal. Pupils are equal, round, and reactive to light.  Neck: Normal range of motion.  Cardiovascular: Normal heart sounds.   Respiratory: Effort normal.  GI: Soft.  Musculoskeletal: Normal range of motion.  Neurological: He is alert.  Skin: Skin is warm and dry. Rash noted. There is erythema.  Psychiatric: His speech is normal and behavior is normal. Judgment normal. His mood appears anxious. Cognition and memory are normal. He exhibits a depressed mood. He expresses suicidal ideation.    Review of Systems  Constitutional: Negative.   HENT: Negative.   Eyes: Negative.   Respiratory: Negative.   Cardiovascular: Negative.   Gastrointestinal: Negative.   Musculoskeletal: Negative.   Skin: Negative.   Neurological: Negative.   Psychiatric/Behavioral: Positive for depression, suicidal ideas and hallucinations. Negative for substance abuse. The patient is nervous/anxious and has insomnia.   All other systems reviewed and are negative.   Blood pressure 128/85, pulse 109, temperature 98.5 F (36.9 C), temperature source Oral, resp. rate 20, height 6' (1.829 m), weight  142.883 kg (315 lb), SpO2 95 %.Body mass index is 42.71 kg/(m^2).  General Appearance: Casual  Eye Contact::  Fair  Speech:  Slow  Volume:  Decreased  Mood:  Depressed  Affect:  Restricted  Thought Process:  Goal Directed and Linear  Orientation:  Full (Time, Place, and Person)  Thought Content:  Negative  Suicidal Thoughts:  No  Homicidal  Thoughts:  No  Memory:  Immediate;   Fair Recent;   Fair Remote;   Fair  Judgement:  Fair  Insight:  Fair  Psychomotor Activity:  Normal  Concentration:  Fair  Recall:  Jesse Pearson of Knowledge:Fair  Language: Fair  Akathisia:  No  Handed:  Right  AIMS (if indicated):     Assets:  Communication Skills Desire for Improvement Financial Resources/Insurance Housing Physical Health Social Support  ADL's:  Intact  Cognition: WNL  Sleep:  Number of Hours: 6     Current Medications: Current Facility-Administered Medications  Medication Dose Route Frequency Provider Last Rate Last Dose  . acetaminophen (TYLENOL) tablet 650 mg  650 mg Oral Q6H PRN Audery Amel, MD      . alum & mag hydroxide-simeth (MAALOX/MYLANTA) 200-200-20 MG/5ML suspension 30 mL  30 mL Oral Q4H PRN Audery Amel, MD      . ARIPiprazole (ABILIFY) tablet 5 mg  5 mg Oral Daily Jolanta B Pucilowska, MD   5 mg at 04/17/15 1023  . FLUoxetine (PROZAC) capsule 20 mg  20 mg Oral Daily Audery Amel, MD   20 mg at 04/17/15 1023  . magnesium hydroxide (MILK OF MAGNESIA) suspension 30 mL  30 mL Oral Daily PRN Audery Amel, MD        Lab Results: No results found for this or any previous visit (from the past 48 hour(s)).  Physical Findings: AIMS: Facial and Oral Movements Muscles of Facial Expression: None, normal Lips and Perioral Area: None, normal Jaw: None, normal Tongue: None, normal,Extremity Movements Upper (arms, wrists, hands, fingers): None, normal Lower (legs, knees, ankles, toes): None, normal, Trunk Movements Neck, shoulders, hips: None, normal, Overall Severity Severity of abnormal movements (highest score from questions above): None, normal Incapacitation due to abnormal movements: None, normal Patient's awareness of abnormal movements (rate only patient's report): No Awareness, Dental Status Current problems with teeth and/or dentures?: No Does patient usually wear dentures?: No  CIWA:    COWS:      Treatment Plan Summary: Daily contact with patient to assess and evaluate symptoms and progress in treatment and Medication management   Medical Decision Making:  Established Problem, Stable/Improving (1), Review of Psycho-Social Stressors (1), Review or order clinical lab tests (1), Review of Medication Regimen & Side Effects (2) and Review of New Medication or Change in Dosage (2)   Mr. Krenz is a 44 year old male with no past psychiatric history admitted for suicidal ideation with a plan in the context of marital problems.  1. Suicidal ideation. He still feels suicidal. He is able to contract for safety in the hospital. He is on 15 minutes checks.  2. Mood. He was started on Prozac and Abilify for depression. Will increase Abilify to 5 mg daily to address hallucinations. . 3. Insomnia. He experiences no sleeping problems in the hospital   4. Productive cough. Chest Xray negative.  5. Disposition. He will be discharged to home he will follow up with one of the local providers. Family meeting would be desirable. I do not think that he is estranged wife would  come but may be his sister who is major support Denies hallucinations today. Still having passive suicidal thoughts and depression. Tolerating medicine well. Continue current medicine and also will add some hydrocortisone for the persistent rash on his face. Psychoeducation and supportive counseling completed.  Syerra Abdelrahman 04/17/2015, 2:36 PM

## 2015-04-17 NOTE — [facility] Group Notes (Signed)
[facility] Group Notes:  (Nursing/MHT/Case Management/Adjunct)  Date:  04/17/2015  Time:  9:28 AM  Type of Therapy:  Goals   Participation Level:  Active  Participation Quality:  Appropriate  Affect:  Appropriate  Cognitive:  Appropriate  Insight:  Appropriate  Engagement in Group:  Supportive  Modes of Intervention:  Goal Setting   Summary of Progress/Problems:  Marquette Old 04/17/2015, 9:28 AM

## 2015-04-17 NOTE — Plan of Care (Signed)
Problem: Alteration in mood Goal: LTG-Pt's behavior demonstrates decreased signs of depression (Patient's behavior demonstrates decreased signs of depression to the point the patient is safe to return home and continue treatment in an outpatient setting)  Outcome: Progressing Pt interacting more. Out of room watching ballgame.

## 2015-04-17 NOTE — Progress Notes (Signed)
In milieu at onset of shift. Retreated to room for sleep at appropriate time. Denied A,V,H and HI, SI. Was pleasant with all interactions. Uneventful night.

## 2015-04-18 DIAGNOSIS — F323 Major depressive disorder, single episode, severe with psychotic features: Principal | ICD-10-CM

## 2015-04-18 MED ORDER — FLUTICASONE PROPIONATE 50 MCG/ACT NA SUSP
1.0000 | Freq: Two times a day (BID) | NASAL | Status: DC
  Administered 2015-04-18 – 2015-04-22 (×9): 1 via NASAL
  Filled 2015-04-18: qty 16

## 2015-04-18 NOTE — Progress Notes (Signed)
[facility] MD Progress Note  04/18/2015 12:41 PM Jesse Pearson  MRN:  [id number]  Subjective: Jesse Pearson disclosed now that he has auditory hallucinations that are derogatory in nature. He has been hearing voices for the past 2 months. He denies command hallucinations. He was started on Abilify couple of days ago for treatment of depression rather than to address psychotic symptoms that we were unaware of.   Patient relates his mood is somewhat better since being on medications. He denies any suicidal ideation at this time. He states that there are multiple things that led to his depression. He states that one he had had some financial issues related to his mother's nursing home payments and subsequent been charged a lot of interest. He also states that he had been told by his wife that she no longer wanted to be married to him. He states this occurred suddenly and without much notice. He states that it appears that the financial issues related to his mother's nursing home have improved and will likely get cleared up. However he is still somewhat troubled by the situation with his wife. He denies any problems with his medications.  They does discuss that he's had a chronic cough of 2 years and that sometimes brings up mucous. I did contact the hospitalist on call and she suggested trying some treatment for postnasal drip. Will start some Flonase. Principal Problem: Severe major depression with psychotic features, mood-congruent Diagnosis:   Patient Active Problem List   Diagnosis Date Noted  . 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 Suicidal ideation [R45.851] 04/11/2015  . Sleep apnea [G47.30] 04/11/2015   Total Time spent with patient: 20 minutes   Past Medical History:  Past Medical History  Diagnosis Date  . Sleep apnea   . Anxiety     Past Surgical History  Procedure Laterality Date  . Appendectomy     Family History: History reviewed.  No pertinent family history. Social History:  History  Alcohol Use No     History  Drug Use Not on file    History   Social History  . Marital Status: Married    Spouse Name: N/A  . Number of Children: N/A  . Years of Education: N/A   Social History Main Topics  . Smoking status: Never Smoker   . Smokeless tobacco: Not on file  . Alcohol Use: No  . Drug Use: Not on file  . Sexual Activity: Not on file   Other Topics Concern  . None   Social History Narrative   Additional History:    Sleep: Good  Appetite:  Good   Assessment:   Musculoskeletal: Strength & Muscle Tone: within normal limits Gait & Station: normal Patient leans: N/A   Psychiatric Specialty Exam: Physical Exam  Nursing note and vitals reviewed. Constitutional: He appears well-developed and well-nourished.  HENT:  Head: Normocephalic and atraumatic.  Eyes: Conjunctivae are normal. Pupils are equal, round, and reactive to light.  Neck: Normal range of motion.  Cardiovascular: Normal heart sounds.   Respiratory: Effort normal.  GI: Soft.  Musculoskeletal: Normal range of motion.  Neurological: He is alert.  Skin: Skin is warm and dry. Rash noted. There is erythema.  Psychiatric: His speech is normal and behavior is normal. Judgment normal. His mood appears anxious. Cognition and memory are normal. He exhibits a depressed mood. He expresses suicidal ideation.    Review of Systems  Constitutional: Negative.   HENT: Negative.   Eyes:  Negative.   Respiratory: Negative.   Cardiovascular: Negative.   Gastrointestinal: Negative.   Musculoskeletal: Negative.   Skin: Negative.   Neurological: Negative.   Psychiatric/Behavioral: Positive for depression, suicidal ideas and hallucinations. Negative for substance abuse. The patient is nervous/anxious and has insomnia.   All other systems reviewed and are negative.   Blood pressure 131/89, pulse 102, temperature 98.3 F (36.8 C), temperature source  Oral, resp. rate 20, height 6' (1.829 m), weight 142.883 kg (315 lb), SpO2 95 %.Body mass index is 42.71 kg/(m^2).  General Appearance: Casual  Eye Contact::  Fair  Speech:  Slow  Volume:  Decreased  Mood:  Depressed and better  Affect:  Restricted  Thought Process:  Goal Directed and Linear  Orientation:  Full (Time, Place, and Person)  Thought Content:  Negative  Suicidal Thoughts:  No  Homicidal Thoughts:  No  Memory:  Immediate;   Fair Recent;   Fair Remote;   Fair  Judgement:  Fair  Insight:  Fair  Psychomotor Activity:  Normal  Concentration:  Fair  Recall:  [employer] of Knowledge:Fair  Language: Fair  Akathisia:  No  Handed:  Right  AIMS (if indicated):     Assets:  Communication Skills Desire for Improvement Financial Resources/Insurance Housing Physical Health Social Support  ADL's:  Intact  Cognition: WNL  Sleep:  Number of Hours: 6.75     Current Medications: Current Facility-Administered Medications  Medication Dose Route Frequency Provider Last Rate Last Dose  . acetaminophen (TYLENOL) tablet 650 mg  650 mg Oral Q6H PRN Audery Amel, MD      . alum & mag hydroxide-simeth (MAALOX/MYLANTA) 200-200-20 MG/5ML suspension 30 mL  30 mL Oral Q4H PRN Audery Amel, MD      . ARIPiprazole (ABILIFY) tablet 5 mg  5 mg Oral Daily Shari Prows, MD   5 mg at 04/18/15 0907  . FLUoxetine (PROZAC) capsule 20 mg  20 mg Oral Daily Audery Amel, MD   20 mg at 04/18/15 0908  . hydrocortisone cream 1 %   Topical BID Audery Amel, MD      . magnesium hydroxide (MILK OF MAGNESIA) suspension 30 mL  30 mL Oral Daily PRN Audery Amel, MD        Lab Results: No results found for this or any previous visit (from the past 48 hour(s)).  Physical Findings: AIMS: Facial and Oral Movements Muscles of Facial Expression: None, normal Lips and Perioral Area: None, normal Jaw: None, normal Tongue: None, normal,Extremity Movements Upper (arms, wrists, hands, fingers):  None, normal Lower (legs, knees, ankles, toes): None, normal, Trunk Movements Neck, shoulders, hips: None, normal, Overall Severity Severity of abnormal movements (highest score from questions above): None, normal Incapacitation due to abnormal movements: None, normal Patient's awareness of abnormal movements (rate only patient's report): No Awareness, Dental Status Current problems with teeth and/or dentures?: No Does patient usually wear dentures?: No  CIWA:    COWS:     Treatment Plan Summary: Daily contact with patient to assess and evaluate symptoms and progress in treatment and Medication management   Medical Decision Making:  Established Problem, Stable/Improving (1), Review of Psycho-Social Stressors (1), Review or order clinical lab tests (1), Review of Medication Regimen & Side Effects (2) and Review of New Medication or Change in Dosage (2)   Jesse Pearson is a 44 year old male with no past psychiatric history admitted for suicidal ideation with a plan in the context of marital problems.  1. Suicidal ideation. He still feels suicidal. He is able to contract for safety in the hospital. He is on 15 minutes checks.  2. Mood. He was started on Prozac and Abilify for depression. Will increase Abilify to 5 mg daily to address hallucinations. . 3. Insomnia. He experiences no sleeping problems in the hospital   4. Productive cough. Chest Xray negative. As discussed with hospitalist, Dr. Clent Ridges will start some Flonase for possible postnasal drip  5. Disposition. He will be discharged to home he will follow up with one of the local providers. Family meeting would be desirable. I do not think that he is estranged wife would come but may be his sister who is major support  Denies hallucinations today. Still having passive suicidal thoughts and depression. Tolerating medicine well. Continue current medicine and also will add some hydrocortisone for the persistent rash on his face.  Psychoeducation and supportive counseling completed.  Wallace Going 04/18/2015, 12:41 PM

## 2015-04-18 NOTE — Plan of Care (Signed)
Problem: [facility] Participation in Recreation Therapeutic Interventions Goal: STG-Patient will demonstrate improved self esteem by identif STG: Self-Esteem - Within 5 treatment sessions, patient will verbalize at least 5 positive affirmation statements in each of 3 treatment sessions to increase self-esteem post d/c. Outcome: Progressing Treatment Session 1; Completed 1 out of 3: At approximately 12:40 pm, LRT met with patient in craft room. Patient verbalized 5 positive affirmation statements. Patient reported it felt "good". LRT encouraged patient to continue saying positive affirmation statements.  Leonette Monarch, LRT/CTRS 06.20.16 4:48 pm

## 2015-04-18 NOTE — Progress Notes (Signed)
Recreation Therapy Notes  Date: 06.20.16 Time: 3:00 pm Location: Craft Room  Group Topic: Self-expression  Goal Area(s) Addresses:  Patient will identify one color per emotion listed on wheel. Patient will verbalize benefit of using art as a means of self-expression. Patient will verbalize one emotion experienced during session. Patient will be educated on other forms of self-expression.  Behavioral Response: Attentive, Interactive  Intervention: Emotion Wheel  Activity: Patients were given a worksheet with 7 different emotions and were instructed to pick a color for each emotion.   Education: LRT educated patient on different forms of self-expression.   Education Outcome: Acknowledges education/In group clarification offered   Clinical Observations/Feedback: Patient completed activity. Patient contributed to group discussion by stating what colors he picked for certain emotions.   Jacquelynn Cree, LRT/CTRS 04/18/2015 4:10 PM

## 2015-04-18 NOTE — Plan of Care (Signed)
Problem: Alteration in mood Goal: LTG-Pt's behavior demonstrates decreased signs of depression (Patient's behavior demonstrates decreased signs of depression to the point the patient is safe to return home and continue treatment in an outpatient setting)  Outcome: Progressing More pleasant on approach.

## 2015-04-18 NOTE — Progress Notes (Signed)
Stated that his depression is getting better.Denies suicidal ideation.Appropriate with staff & peers.Compliant with meds.Attended groups.Appetite good.

## 2015-04-19 NOTE — Plan of Care (Signed)
Problem: Alteration in mood Goal: LTG-Pt's behavior demonstrates decreased signs of depression (Patient's behavior demonstrates decreased signs of depression to the point the patient is safe to return home and continue treatment in an outpatient setting)  Outcome: Progressing States depression and SI improved. No longer having AH.

## 2015-04-19 NOTE — Progress Notes (Signed)
Recreation Therapy Notes  Date: 06.21.16 Time: 3:00 pm Location: Craft Room  Group Topic: Goal Setting   Goal Area(s) Addresses:  Patient will be able to identify one goal. Patient will verbalize benefit of setting goals. Patient will be able to identify at least one positive statement.  Behavioral Response: Attentive  Intervention: Step By Step  Activity: Patients were given a worksheet with a foot on it. Patients were instructed to write a goal on the inside of the foot and to write positive statements/advice on the outside of the foot.  Education: LRT educated patients on healthy ways to celebrate achieving their goals.   Education Outcome: Acknowledges education/In group clarification offered  Clinical Observations/Feedback: Patient completed activity by listing a goal and some positive words. Patient did not contribute to group discussion.  Jacquelynn Cree, LRT/CTRS 04/19/2015 4:08 PM

## 2015-04-19 NOTE — Progress Notes (Signed)
[facility] MD Progress Note  04/19/2015 11:47 AM Jesse Pearson  MRN:  [id number]  Subjective: Jesse Pearson disclosed now that he feels his depression is somewhat better. He indicated that his sleep was good last night and generally feels is improved. States the only issues of been some disturbances by other patients which have occasionally woken him up and some of the prior nights. He states he has not noticed improvement in his cough with the Flonase but is only had 4 doses of it thus far.  We discuss possibly plans for discharge. Patient stated he felt like he was only "50%" better. He denies any intent or plan for suicide but is concerned about whether these feelings will return. He feels the medications have begun to work. He states he is laughing and that is something he has not done for a long time. He did present with FMLA paperwork as well as a application for short-term disability. I discussed with him that I could complete these for his inpatient but any additional updates would need to be done by an outside doctor. Principal Problem: Severe major depression with psychotic features, mood-congruent Diagnosis:   Patient Active Problem List   Diagnosis Date Noted  . 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 Suicidal ideation [R45.851] 04/11/2015  . Sleep apnea [G47.30] 04/11/2015   Total Time spent with patient: 20 minutes   Past Medical History:  Past Medical History  Diagnosis Date  . Sleep apnea   . Anxiety     Past Surgical History  Procedure Laterality Date  . Appendectomy     Family History: History reviewed. No pertinent family history. Social History:  History  Alcohol Use No     History  Drug Use Not on file    History   Social History  . Marital Status: Married    Spouse Name: N/A  . Number of Children: N/A  . Years of Education: N/A   Social History Main Topics  . Smoking status: Never Smoker   . Smokeless  tobacco: Not on file  . Alcohol Use: No  . Drug Use: Not on file  . Sexual Activity: Not on file   Other Topics Concern  . None   Social History Narrative   Additional History:    Sleep: Good  Appetite:  Good   Assessment:   Musculoskeletal: Strength & Muscle Tone: within normal limits Gait & Station: normal Patient leans: N/A   Psychiatric Specialty Exam: Physical Exam  Nursing note and vitals reviewed. Constitutional: He appears well-developed and well-nourished.  HENT:  Head: Normocephalic and atraumatic.  Eyes: Conjunctivae are normal. Pupils are equal, round, and reactive to light.  Neck: Normal range of motion.  Cardiovascular: Normal heart sounds.   Respiratory: Effort normal.  GI: Soft.  Musculoskeletal: Normal range of motion.  Neurological: He is alert.  Skin: Skin is warm and dry. Rash noted. There is erythema.  Psychiatric: His speech is normal and behavior is normal. Judgment normal. His mood appears anxious. Cognition and memory are normal. He exhibits a depressed mood. He expresses suicidal ideation.    Review of Systems  Constitutional: Negative.   HENT: Negative.   Eyes: Negative.   Respiratory: Negative.   Cardiovascular: Negative.   Gastrointestinal: Negative.   Musculoskeletal: Negative.   Skin: Negative.   Neurological: Negative.   Psychiatric/Behavioral: Positive for depression, suicidal ideas and hallucinations. Negative for substance abuse. The patient is nervous/anxious and has insomnia.   All other systems  reviewed and are negative.   Blood pressure 120/82, pulse 100, temperature 98.4 F (36.9 C), temperature source Oral, resp. rate 20, height 6' (1.829 m), weight 142.883 kg (315 lb), SpO2 89 %.Body mass index is 42.71 kg/(m^2).  General Appearance: Casual  Eye Contact::  Fair  Speech:  Slow  Volume:  Decreased  Mood:  Depressed and better  Affect:  Restricted  Thought Process:  Goal Directed and Linear  Orientation:  Full  (Time, Place, and Person)  Thought Content:  Negative  Suicidal Thoughts:  No  Homicidal Thoughts:  No  Memory:  Immediate;   Fair Recent;   Fair Remote;   Fair  Judgement:  Fair  Insight:  Fair  Psychomotor Activity:  Normal  Concentration:  Fair  Recall:  [employer] of Knowledge:Fair  Language: Fair  Akathisia:  No  Handed:  Right  AIMS (if indicated):     Assets:  Communication Skills Desire for Improvement Financial Resources/Insurance Housing Physical Health Social Support  ADL's:  Intact  Cognition: WNL  Sleep:  Number of Hours: 6.5     Current Medications: Current Facility-Administered Medications  Medication Dose Route Frequency Provider Last Rate Last Dose  . acetaminophen (TYLENOL) tablet 650 mg  650 mg Oral Q6H PRN Audery Amel, MD      . alum & mag hydroxide-simeth (MAALOX/MYLANTA) 200-200-20 MG/5ML suspension 30 mL  30 mL Oral Q4H PRN Audery Amel, MD      . ARIPiprazole (ABILIFY) tablet 5 mg  5 mg Oral Daily Jolanta B Pucilowska, MD   5 mg at 04/19/15 1007  . FLUoxetine (PROZAC) capsule 20 mg  20 mg Oral Daily Audery Amel, MD   20 mg at 04/19/15 1007  . fluticasone (FLONASE) 50 MCG/ACT nasal spray 1 spray  1 spray Each Nare BID Kerin Salen, MD   1 spray at 04/19/15 1008  . hydrocortisone cream 1 %   Topical BID Audery Amel, MD      . magnesium hydroxide (MILK OF MAGNESIA) suspension 30 mL  30 mL Oral Daily PRN Audery Amel, MD        Lab Results: No results found for this or any previous visit (from the past 48 hour(s)).  Physical Findings: AIMS: Facial and Oral Movements Muscles of Facial Expression: None, normal Lips and Perioral Area: None, normal Jaw: None, normal Tongue: None, normal,Extremity Movements Upper (arms, wrists, hands, fingers): None, normal Lower (legs, knees, ankles, toes): None, normal, Trunk Movements Neck, shoulders, hips: None, normal, Overall Severity Severity of abnormal movements (highest score from questions  above): None, normal Incapacitation due to abnormal movements: None, normal Patient's awareness of abnormal movements (rate only patient's report): No Awareness, Dental Status Current problems with teeth and/or dentures?: No Does patient usually wear dentures?: No  CIWA:    COWS:     Treatment Plan Summary: Daily contact with patient to assess and evaluate symptoms and progress in treatment and Medication management   Medical Decision Making:  Established Problem, Stable/Improving (1), Review of Psycho-Social Stressors (1), Review or order clinical lab tests (1), Review of Medication Regimen & Side Effects (2) and Review of New Medication or Change in Dosage (2)   Jesse Pearson is a 44 year old male with no past psychiatric history admitted for suicidal ideation with a plan in the context of marital problems.  1. Suicidal ideation. No intent or plan. He still feels suicidal. He is able to contract for safety in the hospital. He is on  15 minutes checks.  2. Mood. He was started on Prozac and Abilify for depression. Port some improvement in his mood but as discussed above indicates he feels "50%" better. . 3. Insomnia. He experiences no sleeping problems in the hospital   4. Productive cough. Continue Flonase.  5. Disposition. He will be discharged to home he will follow up with one of the local providers. Family meeting would be desirable. I do not think that he is estranged wife would come but may be his sister who is major support  Denies hallucinations today. Still having passive suicidal thoughts and depression. Tolerating medicine well. Continue current medicine and also will add some hydrocortisone for the persistent rash on his face. Psychoeducation and supportive counseling completed.  Wallace Going 04/19/2015, 11:47 AM

## 2015-04-19 NOTE — Progress Notes (Signed)
D: Pt is awake and active in the milieu this evening. Pt mood is appropriate and his affect is anxious. Pt denies SI/HI and AVH states that his day is going well.  A: Writer provided emotional support and encouraged participation in unit activities.  R: Pt is attending groups, taking medications as prescribed and is appropriate and cooperative with staff.

## 2015-04-19 NOTE — [facility] Group Notes (Signed)
[facility] Group Notes:  (Nursing/MHT/Case Management/Adjunct)  Date:  04/19/2015  Time:  3:17 PM  Type of Therapy:  Psychoeducational Skills  Participation Level:  Minimal  Participation Quality:  Appropriate  Affect:  Flat  Cognitive:  Appropriate  Insight:  Appropriate  Engagement in Group:  Limited  Modes of Intervention:  Support  Summary of Progress/Problems:  Jesse Pearson 04/19/2015, 3:17 PM

## 2015-04-19 NOTE — Progress Notes (Signed)
Patient states he is feeling better "in every way." States depression has improved and SI improved. Has occasional thoughts of SI but no plan or intent. Protective factors are his son and sister's love and support. He denies AH but says he has thoughts of worthlessness at times. Attending groups. Cooperative with meds.

## 2015-04-19 NOTE — [facility] Group Notes (Signed)
[facility] Group Notes:  (Nursing/MHT/Case Management/Adjunct)  Date:  04/19/2015  Time:  10:43 PM  Type of Therapy:  Group Therapy  Participation Level:  Active  Participation Quality:  Appropriate  Affect:  Appropriate  Cognitive:  Alert  Insight:  Good  Engagement in Group:  Developing/Improving  Modes of Intervention:  n/a  Summary of Progress/Problems:  Veva Holes 04/19/2015, 10:43 PM

## 2015-04-19 NOTE — Plan of Care (Signed)
Problem: [facility] Participation in Recreation Therapeutic Interventions Goal: STG-Patient will demonstrate improved self esteem by identif STG: Self-Esteem - Within 5 treatment sessions, patient will verbalize at least 5 positive affirmation statements in each of 3 treatment sessions to increase self-esteem post d/c.  Outcome: Progressing Treatment Session 2; Completed 2 out of 3: At approximately 9:25 am, LRT met with patient in craft room. Patient verbalized 5 positive affirmation statements. Patient reported it felt "great". LRT encouraged patient to continue saying positive affirmation statements.  Leonette Monarch, LRT/CTRS 06.21.16 1:25 pm

## 2015-04-20 ENCOUNTER — Inpatient Hospital Stay: Payer: PRIVATE HEALTH INSURANCE

## 2015-04-20 NOTE — Progress Notes (Signed)
Patient rated his depression 5/10.Denies suicidal ideation.Minimal interaction with staff & peers.Visible in the milieu.Pleasant on approach.Attended groups.Compliant with meds.

## 2015-04-20 NOTE — Plan of Care (Signed)
Problem: [facility] Participation in Recreation Therapeutic Interventions Goal: STG-Patient will demonstrate improved self esteem by identif STG: Self-Esteem - Within 5 treatment sessions, patient will verbalize at least 5 positive affirmation statements in each of 3 treatment sessions to increase self-esteem post d/c.  Outcome: Completed/Met Date Met:  04/20/15 Treatment Session 3; Completed 3 out of 3: At approximately 11:45 am, LRT met with patient in patient room. Patient verbalized 5 positive affirmation statements. Patient reported it felt "okay". LRT encouraged patient to continue saying positive affirmation statements.  Leonette Monarch, LRT/CTRS 06.22.16 1:51 pm

## 2015-04-20 NOTE — Tx Team (Signed)
Interdisciplinary Treatment Plan Update (Adult)  Date:  04/20/2015 Time Reviewed:  12:18 PM  Progress in Treatment: Attending groups: Yes. Participating in groups:  Yes. Taking medication as prescribed:  Yes. Tolerating medication:  Yes. Family/Significant othe contact made:  No, will contact:  patient's sister Patient understands diagnosis:  Yes. Discussing patient identified problems/goals with staff:  Yes. Medical problems stabilized or resolved:  Yes. Denies suicidal/homicidal ideation: Yes. Issues/concerns per patient self-inventory:  No. Other:  New problem(s) identified: No, Describe:  none reported  Discharge Plan or Barriers: Patient referred to [facility] as patient has been hospitalized beyond 5 days. Patient plans to discharge home and follow up with [facility] in [place].   Reason for Continuation of Hospitalization: Depression Suicidal ideation  Comments:  Estimated length of stay: up to 2 days  New goal(s):  Review of initial/current patient goals per problem list:   See Care Plan  Attendees: Physician:  Wallace Going, MD 6/22/201612:18 PM  Nursing:   Cristela Felt, RN 6/22/201612:18 PM  Other:  Beryl Meager, LCSWA 6/22/201612:18 PM  Other:  Jake Shark, LCSW 6/22/201612:18 PM  Other:  Hershal Coria, LRT 6/22/201612:18 PM  Other: Carola Frost, PsychD.  6/22/201612:18 PM  Other:   6/22/201612:18 PM   Scribe for Treatment Team:   Beryl Meager T, 04/20/2015, 12:18 PM

## 2015-04-20 NOTE — [facility] Group Notes (Signed)
[facility] Group Notes:  (Nursing/MHT/Case Management/Adjunct)  Date:  04/20/2015  Time:  11:51 AM  Type of Therapy:  Psychoeducational Skills  Participation Level:  Minimal  Participation Quality:  Attentive  Affect:  Flat  Cognitive:  Appropriate  Insight:  Appropriate  Engagement in Group:  Limited  Modes of Intervention:  Support  Summary of Progress/Problems:  Marquette Old 04/20/2015, 11:51 AM

## 2015-04-20 NOTE — Progress Notes (Signed)
D: Pt is awake and active on the unit. Pt mood is depressed and his affect is sad. Pt endorses passive SI but he does contract for safety.   A: Writer provided emotional support and encouraged pt to participate in unit activities.  R: Pt is attending groups and is pleasant and cooperative with staff.

## 2015-04-20 NOTE — Plan of Care (Signed)
Problem: Diagnosis: Increased Risk For Suicide Attempt Goal: LTG-Patient Will Report Improved Mood and Deny Suicidal LTG (by discharge) Patient will report improved mood and deny suicidal ideation.  Outcome: Progressing Patient denies suicidal ideation.     

## 2015-04-20 NOTE — Progress Notes (Signed)
Recreation Therapy Notes  Date: 06.22.16 Time: 3:00 pm Location: Craft Room  Group Topic: Self-esteem  Goal Area(s) Addresses:  Patient will be able to identify benefit of self-esteem. Patient will be able to identify ways to increase self-esteem.  Behavioral Response: Attentive, Interactive  Intervention: Self-Portrait  Activity: Patients were instructed to draw their self-portrait, write something positive about themselves and their peers, and draw their self-portrait after they read the positive things peers wrote.   Education:LRT educated patients on ways to increase their self-esteem.   Education Outcome: Acknowledges education/In group clarification offered  Clinical Observations/Feedback: Patient drew both self-portraits, wrote a positive trait about himself and his peers. Patient did not contribute to group discussion.  Jacquelynn Cree, LRT/CTRS 04/20/2015 4:36 PM

## 2015-04-20 NOTE — Progress Notes (Signed)
[facility] MD Progress Note  04/20/2015 4:11 PM JAYLN MADEIRA  MRN:  [id number]  Subjective: Mr. Garden disclosed now that he feels his depression is somewhat better. Today I discussed the option of perhaps being discharged to intensive outpatient program. He was somewhat aware of this but also was aware that it was located in [place]. He indicated that would be difficult for him to drive there.  Gen. reviewed the issues with his chronic cough. He states that the Flonase helped a little bit but not tremendously. Principal Problem: Severe major depression with psychotic features, mood-congruent Diagnosis:   Patient Active Problem List   Diagnosis Date Noted  . 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 Suicidal ideation [R45.851] 04/11/2015  . Sleep apnea [G47.30] 04/11/2015   Total Time spent with patient: 20 minutes   Past Medical History:  Past Medical History  Diagnosis Date  . Sleep apnea   . Anxiety     Past Surgical History  Procedure Laterality Date  . Appendectomy     Family History: History reviewed. No pertinent family history. Social History:  History  Alcohol Use No     History  Drug Use Not on file    History   Social History  . Marital Status: Married    Spouse Name: N/A  . Number of Children: N/A  . Years of Education: N/A   Social History Main Topics  . Smoking status: Never Smoker   . Smokeless tobacco: Not on file  . Alcohol Use: No  . Drug Use: Not on file  . Sexual Activity: Not on file   Other Topics Concern  . None   Social History Narrative   Additional History:    Sleep: Good  Appetite:  Good   Assessment:   Musculoskeletal: Strength & Muscle Tone: within normal limits Gait & Station: normal Patient leans: N/A   Psychiatric Specialty Exam: Physical Exam  Nursing note and vitals reviewed. Constitutional: He appears well-developed and well-nourished.  HENT:  Head:  Normocephalic and atraumatic.  Eyes: Conjunctivae are normal. Pupils are equal, round, and reactive to light.  Neck: Normal range of motion.  Cardiovascular: Normal heart sounds.   Respiratory: Effort normal.  GI: Soft.  Musculoskeletal: Normal range of motion.  Neurological: He is alert.  Skin: Skin is warm and dry. Rash noted. There is erythema.  Psychiatric: His speech is normal and behavior is normal. Judgment normal. His mood appears anxious. Cognition and memory are normal. He exhibits a depressed mood. He expresses suicidal ideation.    Review of Systems  Constitutional: Negative.   HENT: Negative.   Eyes: Negative.   Respiratory: Negative.   Cardiovascular: Negative.   Gastrointestinal: Negative.   Musculoskeletal: Negative.   Skin: Negative.   Neurological: Negative.   Psychiatric/Behavioral: Positive for depression, suicidal ideas and hallucinations. Negative for substance abuse. The patient is nervous/anxious and has insomnia.   All other systems reviewed and are negative.   Blood pressure 126/101, pulse 97, temperature 98.2 F (36.8 C), temperature source Oral, resp. rate 20, height 6' (1.829 m), weight 142.883 kg (315 lb), SpO2 94 %.Body mass index is 42.71 kg/(m^2).  General Appearance: Casual  Eye Contact::  Fair  Speech:  Slow  Volume:  Decreased  Mood:  Depressed and better  Affect:  Restricted  Thought Process:  Goal Directed and Linear  Orientation:  Full (Time, Place, and Person)  Thought Content:  Negative  Suicidal Thoughts:  No  Homicidal Thoughts:  No  Memory:  Immediate;   Fair Recent;   Fair Remote;   Fair  Judgement:  Fair  Insight:  Fair  Psychomotor Activity:  Normal  Concentration:  Fair  Recall:  [employer] of Knowledge:Fair  Language: Fair  Akathisia:  No  Handed:  Right  AIMS (if indicated):     Assets:  Communication Skills Desire for Improvement Financial Resources/Insurance Housing Physical Health Social Support  ADL's:   Intact  Cognition: WNL  Sleep:  Number of Hours: 6     Current Medications: Current Facility-Administered Medications  Medication Dose Route Frequency Provider Last Rate Last Dose  . acetaminophen (TYLENOL) tablet 650 mg  650 mg Oral Q6H PRN Audery Amel, MD      . alum & mag hydroxide-simeth (MAALOX/MYLANTA) 200-200-20 MG/5ML suspension 30 mL  30 mL Oral Q4H PRN Audery Amel, MD      . ARIPiprazole (ABILIFY) tablet 5 mg  5 mg Oral Daily Jolanta B Pucilowska, MD   5 mg at 04/20/15 0913  . FLUoxetine (PROZAC) capsule 20 mg  20 mg Oral Daily Audery Amel, MD   20 mg at 04/20/15 0913  . fluticasone (FLONASE) 50 MCG/ACT nasal spray 1 spray  1 spray Each Nare BID Kerin Salen, MD   1 spray at 04/20/15 0913  . hydrocortisone cream 1 %   Topical BID Audery Amel, MD      . magnesium hydroxide (MILK OF MAGNESIA) suspension 30 mL  30 mL Oral Daily PRN Audery Amel, MD        Lab Results: No results found for this or any previous visit (from the past 48 hour(s)).  Physical Findings: AIMS: Facial and Oral Movements Muscles of Facial Expression: None, normal Lips and Perioral Area: None, normal Jaw: None, normal Tongue: None, normal,Extremity Movements Upper (arms, wrists, hands, fingers): None, normal Lower (legs, knees, ankles, toes): None, normal, Trunk Movements Neck, shoulders, hips: None, normal, Overall Severity Severity of abnormal movements (highest score from questions above): None, normal Incapacitation due to abnormal movements: None, normal Patient's awareness of abnormal movements (rate only patient's report): No Awareness, Dental Status Current problems with teeth and/or dentures?: No Does patient usually wear dentures?: No  CIWA:    COWS:     Treatment Plan Summary: Daily contact with patient to assess and evaluate symptoms and progress in treatment and Medication management   Medical Decision Making:  Established Problem, Stable/Improving (1), Review of  Psycho-Social Stressors (1), Review or order clinical lab tests (1), Review of Medication Regimen & Side Effects (2) and Review of New Medication or Change in Dosage (2)   Mr. Shaver is a 44 year old male with no past psychiatric history admitted for suicidal ideation with a plan in the context of marital problems.  1. Suicidal ideation. No intent or plan. He is able to contract for safety in the hospital. He is on 15 minutes checks.  2. Mood. He was started on Prozac and Abilify for depression. Port some improvement in his mood but as discussed above indicates he feels "50%" better. . 3. Insomnia. He experiences no sleeping problems in the hospital   4. Productive cough. Continue Flonase. Will order a chest CT w/o contrast.  5. Disposition. He will be discharged to home he will follow up with one of the local providers.    Wallace Going 04/20/2015, 4:11 PM

## 2015-04-21 MED ORDER — AZITHROMYCIN 250 MG PO TABS
250.0000 mg | ORAL_TABLET | Freq: Every day | ORAL | Status: DC
  Administered 2015-04-22: 250 mg via ORAL
  Filled 2015-04-21 (×2): qty 1

## 2015-04-21 MED ORDER — AZITHROMYCIN 500 MG PO TABS
500.0000 mg | ORAL_TABLET | Freq: Once | ORAL | Status: AC
  Administered 2015-04-21: 500 mg via ORAL
  Filled 2015-04-21: qty 1

## 2015-04-21 MED ORDER — PANTOPRAZOLE SODIUM 40 MG PO TBEC
40.0000 mg | DELAYED_RELEASE_TABLET | Freq: Every day | ORAL | Status: DC
  Administered 2015-04-21 – 2015-04-22 (×2): 40 mg via ORAL
  Filled 2015-04-21 (×2): qty 1

## 2015-04-21 NOTE — Progress Notes (Signed)
Dietrick has been around, interacting appropriately with staff and peers. Pleasant and cooperative and denying SI/HI. Attending groups and compliant with medication regime. Reports feeling improved in mood and had no concern so far. Support offered and safety maintained.

## 2015-04-21 NOTE — Plan of Care (Signed)
Problem: Ineffective individual coping Goal: STG: Patient will remain free from self harm Outcome: Progressing Pt has not had any attempts to harm self or others.        

## 2015-04-21 NOTE — Consult Note (Signed)
Medical Consultation  Jesse Pearson:[id number] DOB: Dec 02, 1970 DOA: 04/11/2015 PCP: Rafael Bihari, MD   Requesting physician: Dr. Mayford Knife Date of consultation: 04/21/2015 Reason for consultation: Chronic cough  Impression/Recommendations  1. Chronic cough: Patient has had this cough which is productive for the past 2 years. CT scan of the chest shows a small nodule as well as bronchiolitis. He has tried Flonase with no relief. 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 If his symptoms do not improve after 1 month on a PPI and Flonase, I suggest that the patient see a pulmonologist. Another etiology of chronic cough is adult onset asthma. He will need to see a pulmonologist and PFTs to make this diagnosis.  2. Lung nodule: He does not smoke and will need a chest CT in 1 year.  3. Depression: As per psychiatry.  4. Obstructive sleep apnea: Patient should use his CPAP every night. I discussed that patient is at high risk of right heart failure and other issues if he is not compliant with his CPAP.  Chief Complaint: Chronic cough  HPI:  This is a 44 year old male who is seen in behavioral health as per the request of psychiatry for chronic cough. Patient endorses that he has had this cough for over 2 years. Patient denies tobacco use. He has an exposed to secondhand smoking but this is mainly on weekends. He says he has a productive cough with clear sputum. He denies wheezing or shortness of breath. He denies chest pain. He also says he has sleep apnea. He does not wear his CPAP as prescribed due to the mask.  Review of Systems  Constitutional: Negative for fever, chills weight loss HENT: Negative for ear pain, nosebleeds, congestion, facial swelling, rhinorrhea, neck pain, neck stiffness and ear discharge.   Respiratory: Positive cough, negative shortness of breath and wheezing  Cardiovascular: Negative for chest  pain, palpitations and leg swelling.  Gastrointestinal: Negative for heartburn, abdominal pain, vomiting, diarrhea or consitpation Genitourinary: Negative for dysuria, urgency, frequency, hematuria Musculoskeletal: Negative for back pain or joint pain Neurological: Negative for dizziness, seizures, syncope, focal weakness,  numbness and headaches.  Hematological: Does not bruise/bleed easily.  Psychiatric/Behavioral: Patient denies suicidal ideation.   Past Medical History  Diagnosis Date  . Sleep apnea   . Anxiety    Past Surgical History  Procedure Laterality Date  . Appendectomy     Social History:  reports that he has never smoked. He does not have any smokeless tobacco history on file. He reports that he does not drink alcohol. His drug history is not on file.  Allergies  Allergen Reactions  . Cephalosporins Other (See Comments)    Reaction:  Unknown   . Clindamycin/Lincomycin Other (See Comments)    Reaction:  Unknown    Family history: Patient says his mom has also missed dementia. His father had lung cancer.  Prior to Admission medications   Medication Sig Start Date End Date Taking? Authorizing Provider  tobramycin-dexamethasone [facility]) ophthalmic solution Place 1 drop into the right eye QID. For 7 days. Picked up on 03-16-15   Yes Historical Provider, MD    Physical Exam: Blood pressure 113/87, pulse 102, temperature 98.4 F (36.9 C), temperature source Oral, resp. rate 20, height 6' (1.829 m), weight 142.883 kg (315 lb), SpO2 95 %. @ Filed Weights   04/11/15 2300  Weight: 142.883 kg (315 lb)    Intake/Output Summary (Last 24 hours) at 04/21/15  1336 Last data filed at 04/21/15 0912  Gross per 24 hour  Intake   1080 ml  Output      0 ml  Net   1080 ml     Constitutional: Appears well-developed and well-nourished. Overweight. No distress. HENT: Normocephalic.  Oropharynx is clear and moist. He has large tonsils no erythema noted or exudate Eyes:  Conjunctivae and EOM are normal. PERRLA, no scleral icterus.  Neck: Normal ROM. Neck supple. No JVD. No tracheal deviation. .  CVS: RRR, S1/S2 +, no murmurs, no gallops, no carotid bruit.  Pulmonary: Effort and breath sounds normal, no stridor, rhonchi, wheezes, rales.  Abdominal: Soft. BS +,  no distension, tenderness, rebound or guarding.  Musculoskeletal: Normal range of motion. Minimal 1+ pitting edema lower extremities  Neuro: Alert. Normal reflexes, muscle tone coordination. No cranial nerve deficit. Skin: Skin is warm and dry. No rash noted. Not diaphoretic. No erythema. No pallor.  Psychiatric: Normal mood and affect. Behavior, judgment, thought content normal.    Labs  Basic Metabolic Panel: No results for input(s): NA, K, CL, CO2, GLUCOSE, BUN, CREATININE, CALCIUM, MG, PHOS in the last 168 hours. Liver Function Tests: No results for input(s): AST, ALT, ALKPHOS, BILITOT, PROT, ALBUMIN in the last 168 hours. No results for input(s): LIPASE, AMYLASE in the last 168 hours.  CBC: No results for input(s): WBC, NEUTROABS, HGB, HCT, MCV, PLT in the last 168 hours. Cardiac Enzymes: No results for input(s): CKTOTAL, CKMB, CKMBINDEX, TROPONINI in the last 168 hours. BNP: Invalid input(s): POCBNP CBG: No results for input(s): GLUCAP in the last 168 hours.  Radiological Exams: Ct Chest Wo Contrast  04/20/2015 IMPRESSION: 1. 3 mm right middle lobe nodule. If the patient is at high risk for bronchogenic carcinoma, follow-up chest CT at 1 year is recommended. If the patient is at low risk, no follow-up is needed. This recommendation follows the consensus statement: Guidelines for Management of Small Pulmonary Nodules Detected on CT Scans: A Statement from the Fleischner Society as published in Radiology 2005; 237:395-400. 2. Minimal cylindrical bronchiectasis and airway thickening in the lower lobes, with some faint reticulonodular peripheral and interstitial accentuation potentially  reflecting low grade atypical infectious bronchiolitis.   Electronically Signed   By: Gaylyn Rong M.D.   On: 04/20/2015 16:53    EKG: none  Thank you for allowing me to participate in the care of your patient. We will continue to follow.   Time spent: 45  Katalea Ucci, MD

## 2015-04-21 NOTE — Progress Notes (Signed)
Recreation Therapy Notes  Date: 06.23.16 Time: 3:00 pm Location: Craft Room  Group Topic: Leisure Education  Goal Area(s) Addresses:  Patient will identify one positive leisure activity.  Behavioral Response: Attentive  Intervention: Leisure Time  Activity: Patients were instructed to write down one positive leisure activity. Patients were instructed to completed "Leisure Time Clock" worksheet. Patients were instructed to list 10 positive emotions as a group. Patients were instructed to match the leisure activities with the emotions.   Education: LRT educated patient on what was needed to participate in leisure.   Education Outcome: Acknowledges education/In group clarification offered  Clinical Observations/Feedback: Patient wrote one positive leisure activity, completed the "Leisure Time Clock" worksheet, listed positive emotions, and matched the leisure activities with the emotions. Patient did not contribute to group discussion.  Jacquelynn Cree, LRT/CTRS 04/21/2015 4:32 PM

## 2015-04-21 NOTE — Progress Notes (Signed)
Pt was visible in the milieu during the latter part of the evening watching TV. Calm and cooperative with unit routine. No attempts to harm self or others. Compliant with hs medications. Rates mood as 5/10. Flat affect. Vet reports that he is doing much better than when he came to the hospital. Will cont to monitor per protocol for continuity care and safety.

## 2015-04-21 NOTE — Progress Notes (Signed)
Patient improving in structured environment. He states his depression is decreasing, which he attributes to medications. Readily discussing issues with staff and receptive to teaching.  Will continue with current treatment plan, monitor mood, mental status, and safety. Educate coping skills.

## 2015-04-21 NOTE — [facility] Group Notes (Signed)
[facility] Group Notes:  (Nursing/MHT/Case Management/Adjunct)  Date:  04/21/2015  Time:  3:49 PM  Type of Therapy:  Movement Therapy  Participation Level:  Active  Participation Quality:  Appropriate and Attentive  Affect:  Appropriate  Cognitive:  Alert and Appropriate  Insight:  Appropriate  Engagement in Group:  Engaged  Modes of Intervention:  Activity  Summary of Progress/Problems:  Jesse Pearson 04/21/2015, 3:49 PM

## 2015-04-21 NOTE — [facility] Group Notes (Signed)
[facility] Group Notes:  (Nursing/MHT/Case Management/Adjunct)  Date:  04/21/2015  Time:  4:09 AM  Type of Therapy:  Psychoeducational Skills  Participation Level:  Active  Participation Quality:  Appropriate and Sharing  Affect:  Appropriate  Cognitive:  Appropriate and Oriented  Insight:  Appropriate, Good and Improving  Engagement in Group:  Engaged and Improving  Modes of Intervention:  Discussion, Education and Exploration  Summary of Progress/Problems:  Foy Guadalajara 04/21/2015, 4:09 AM

## 2015-04-21 NOTE — Progress Notes (Signed)
[facility] MD Progress Note  04/21/2015 1:10 PM Jesse Pearson  MRN:  [id number]  Subjective: Jesse Pearson disclosed now that he feels his depression tin use to improve. He states he's not as hopeless as he was. He states that on the outside his 43 year old son had really tried to pick his mood up and he looks forward to getting back to his 8 year old son.  Gen. reviewed the issues with his chronic cough.  Discussed the results of the CT of the chest with him. Informed patient that he has a small 3 mm nodule and per radiology recommendations at this just be followed up in one year. I also informed him to discuss this with his primary care through his primary care would be aware of this. I contacted medicine for a medicine consult regarding this chronic cough and any other suggestions. Principal Problem: Severe major depression with psychotic features, mood-congruent Diagnosis:   Patient Active Problem List   Diagnosis Date Noted  . 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 Suicidal ideation [R45.851] 04/11/2015  . Sleep apnea [G47.30] 04/11/2015   Total Time spent with patient: 20 minutes   Past Medical History:  Past Medical History  Diagnosis Date  . Sleep apnea   . Anxiety     Past Surgical History  Procedure Laterality Date  . Appendectomy     Family History: History reviewed. No pertinent family history. Social History:  History  Alcohol Use No     History  Drug Use Not on file    History   Social History  . Marital Status: Married    Spouse Name: N/A  . Number of Children: N/A  . Years of Education: N/A   Social History Main Topics  . Smoking status: Never Smoker   . Smokeless tobacco: Not on file  . Alcohol Use: No  . Drug Use: Not on file  . Sexual Activity: Not on file   Other Topics Concern  . None   Social History Narrative   Additional History:    Sleep: Good  Appetite:  Good   Assessment:    Musculoskeletal: Strength & Muscle Tone: within normal limits Gait & Station: normal Patient leans: N/A   Psychiatric Specialty Exam: Physical Exam  Nursing note and vitals reviewed. Constitutional: He appears well-developed and well-nourished.  HENT:  Head: Normocephalic and atraumatic.  Eyes: Conjunctivae are normal. Pupils are equal, round, and reactive to light.  Neck: Normal range of motion.  Cardiovascular: Normal heart sounds.   Respiratory: Effort normal.  GI: Soft.  Musculoskeletal: Normal range of motion.  Neurological: He is alert.  Skin: Skin is warm and dry. Rash noted. There is erythema.  Psychiatric: His speech is normal and behavior is normal. Judgment normal. His mood appears anxious. Cognition and memory are normal. He exhibits a depressed mood. He expresses suicidal ideation.    Review of Systems  Constitutional: Negative.   HENT: Negative.   Eyes: Negative.   Respiratory: Negative.   Cardiovascular: Negative.   Gastrointestinal: Negative.   Musculoskeletal: Negative.   Skin: Negative.   Neurological: Negative.   Psychiatric/Behavioral: Positive for depression, suicidal ideas and hallucinations. Negative for substance abuse. The patient is nervous/anxious and has insomnia.   All other systems reviewed and are negative.   Blood pressure 113/87, pulse 102, temperature 98.4 F (36.9 C), temperature source Oral, resp. rate 20, height 6' (1.829 m), weight 142.883 kg (315 lb), SpO2 95 %.Body mass index is 42.71 kg/(m^2).  General  Appearance: Casual  Eye Contact::  Fair  Speech:  Slow  Volume:  Decreased  Mood:  Depressed and better  Affect:  Restricted  Thought Process:  Goal Directed and Linear  Orientation:  Full (Time, Place, and Person)  Thought Content:  Negative  Suicidal Thoughts:  No  Homicidal Thoughts:  No  Memory:  Immediate;   Fair Recent;   Fair Remote;   Fair  Judgement:  Fair  Insight:  Fair  Psychomotor Activity:  Normal   Concentration:  Fair  Recall:  [employer] of Knowledge:Fair  Language: Fair  Akathisia:  No  Handed:  Right  AIMS (if indicated):     Assets:  Communication Skills Desire for Improvement Financial Resources/Insurance Housing Physical Health Social Support  ADL's:  Intact  Cognition: WNL  Sleep:  Number of Hours: 6.25     Current Medications: Current Facility-Administered Medications  Medication Dose Route Frequency Provider Last Rate Last Dose  . acetaminophen (TYLENOL) tablet 650 mg  650 mg Oral Q6H PRN Audery Amel, MD      . alum & mag hydroxide-simeth (MAALOX/MYLANTA) 200-200-20 MG/5ML suspension 30 mL  30 mL Oral Q4H PRN Audery Amel, MD      . ARIPiprazole (ABILIFY) tablet 5 mg  5 mg Oral Daily Jolanta B Pucilowska, MD   5 mg at 04/21/15 0912  . FLUoxetine (PROZAC) capsule 20 mg  20 mg Oral Daily Audery Amel, MD   20 mg at 04/21/15 0912  . fluticasone (FLONASE) 50 MCG/ACT nasal spray 1 spray  1 spray Each Nare BID Kerin Salen, MD   1 spray at 04/21/15 0912  . hydrocortisone cream 1 %   Topical BID Audery Amel, MD      . magnesium hydroxide (MILK OF MAGNESIA) suspension 30 mL  30 mL Oral Daily PRN Audery Amel, MD        Lab Results: No results found for this or any previous visit (from the past 48 hour(s)).  Physical Findings: AIMS: Facial and Oral Movements Muscles of Facial Expression: None, normal Lips and Perioral Area: None, normal Jaw: None, normal Tongue: None, normal,Extremity Movements Upper (arms, wrists, hands, fingers): None, normal Lower (legs, knees, ankles, toes): None, normal, Trunk Movements Neck, shoulders, hips: None, normal, Overall Severity Severity of abnormal movements (highest score from questions above): None, normal Incapacitation due to abnormal movements: None, normal Patient's awareness of abnormal movements (rate only patient's report): No Awareness, Dental Status Current problems with teeth and/or dentures?: No Does  patient usually wear dentures?: No  CIWA:    COWS:     Treatment Plan Summary: Daily contact with patient to assess and evaluate symptoms and progress in treatment and Medication management   Medical Decision Making:  Established Problem, Stable/Improving (1), Review of Psycho-Social Stressors (1), Review or order clinical lab tests (1), Review of Medication Regimen & Side Effects (2) and Review of New Medication or Change in Dosage (2)   Jesse Pearson is a 44 year old male with no past psychiatric history admitted for suicidal ideation with a plan in the context of marital problems.  1. Suicidal ideation. No intent or plan. He is able to contract for safety in the hospital. He is on 15 minutes checks.  2. Mood. He was started on Prozac and Abilify for depression. Port some improvement in his mood but as discussed above indicates he feels "50%" better. . 3. Insomnia. He experiences no sleeping problems in the hospital   4. Productive  cough. CT of the chest results indicate 3 mm nodule in the right middle lobe and some bronchiolitis. I have contacted medicine for a medicine consult regarding this matter. Spoke with Dr. Allena Katz about this issue.  5. Disposition. He will be discharged to home he will follow up with one of the local providers.    Jesse Pearson 04/21/2015, 1:10 PM

## 2015-04-21 NOTE — [facility] Group Notes (Signed)
[facility] Group Notes:  (Nursing/MHT/Case Management/Adjunct)  Date:  04/21/2015  Time:  9:12 AM  Type of Therapy:  Community Meeting   Participation Level:  Active  Participation Quality:  Appropriate and Sharing  Affect:  Appropriate  Cognitive:  Alert, Appropriate and Oriented  Insight:  Appropriate  Engagement in Group:  Engaged  Modes of Intervention:  Discussion  Summary of Progress/Problems:  Verleen Stuckey De'Chelle Gia Lusher 04/21/2015, 9:12 AM

## 2015-04-22 MED ORDER — FLUOXETINE HCL 20 MG PO CAPS: 20.0000 mg | ORAL_CAPSULE | Freq: Every day | ORAL | Status: DC

## 2015-04-22 MED ORDER — HYDROCORTISONE 1 % EX CREA: TOPICAL_CREAM | Freq: Two times a day (BID) | CUTANEOUS | Status: AC

## 2015-04-22 MED ORDER — ARIPIPRAZOLE 5 MG PO TABS: 5.0000 mg | ORAL_TABLET | Freq: Every day | ORAL | Status: AC

## 2015-04-22 MED ORDER — PANTOPRAZOLE SODIUM 40 MG PO TBEC: 40.0000 mg | DELAYED_RELEASE_TABLET | Freq: Every day | ORAL | Status: DC

## 2015-04-22 MED ORDER — AZITHROMYCIN 250 MG PO TABS: ORAL_TABLET | ORAL | Status: DC

## 2015-04-22 MED ORDER — FLUTICASONE PROPIONATE 50 MCG/ACT NA SUSP: 1.0000 | Freq: Two times a day (BID) | NASAL | Status: AC

## 2015-04-22 NOTE — Plan of Care (Signed)
Problem: Alteration in mood Goal: STG-Patient is able to discuss feelings and issues (Patient is able to discuss feelings and issues leading to depression)  Outcome: Progressing Stated that his depression is getting better.

## 2015-04-22 NOTE — Progress Notes (Signed)
Patient discharged ambulatory to home, accompanied by sister. Patient denies SI or HI. Discharge instructions reviewed with patient, he verbalizes understanding. Patient received copy of discharge instructions, prescriptions, and all personal belongings.

## 2015-04-22 NOTE — Discharge Summary (Signed)
Physician Discharge Summary Note  Patient:  Jesse Pearson is an 44 y.o., male MRN:  [id number] DOB:  02-28-1971 Patient phone:  [phone] (home)  Patient address:   [address] [place] [address],  Total Time spent with patient: 30 minutes  Date of Admission:  04/11/2015 Date of Discharge: 04/22/2015  Reason for Admission: Depression with psychotic features and suicidal ideation  Principal Problem: Severe major depression with psychotic features, mood-congruent Discharge Diagnoses: Patient Active Problem List   Diagnosis Date Noted  . 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 Suicidal ideation [R45.851] 04/11/2015  . Sleep apnea [G47.30] 04/11/2015    Musculoskeletal: Strength & Muscle Tone: within normal limits Gait & Station: normal Patient leans: N/A  Psychiatric Specialty Exam: Physical Exam  ROS  Blood pressure 115/81, pulse 111, temperature 97.7 F (36.5 C), temperature source Oral, resp. rate 20, height 6' (1.829 m), weight 142.883 kg (315 lb), SpO2 95 %.Body mass index is 42.71 kg/(m^2).  General Appearance: Well Groomed  Patent attorney::  Good  Speech:  Normal Rate  Volume:  Normal  Mood:  Better  Affect:  Full Range  Thought Process:  Linear and Logical  Orientation:  Full (Time, Place, and Person)  Thought Content:  Negative  Suicidal Thoughts:  No  Homicidal Thoughts:  No  Memory:  Immediate;   Good Recent;   Good Remote;   Good  Judgement:  Good  Insight:  Good  Psychomotor Activity:  Negative  Concentration:  Good  Recall:  Good  Fund of Knowledge:Good  Language: Good  Akathisia:  Negative  Handed:  Rightunknown  AIMS (if indicated):     Assets:  Communication Skills Desire for Improvement Social Support Vocational/Educational  ADL's:  Intact  Cognition: WNL  Sleep:  Number of Hours: 7.25   Have you used any form of tobacco in the last 30 days? (Cigarettes, Smokeless  Tobacco, Cigars, and/or Pipes): No  Has this patient used any form of tobacco in the last 30 days? (Cigarettes, Smokeless Tobacco, Cigars, and/or Pipes) No  Past Medical History:  Past Medical History  Diagnosis Date  . Sleep apnea   . Anxiety     Past Surgical History  Procedure Laterality Date  . Appendectomy     Family History: History reviewed. No pertinent family history. Social History:  History  Alcohol Use No     History  Drug Use Not on file    History   Social History  . Marital Status: Married    Spouse Name: N/A  . Number of Children: N/A  . Years of Education: N/A   Social History Main Topics  . Smoking status: Never Smoker   . Smokeless tobacco: Not on file  . Alcohol Use: No  . Drug Use: Not on file  . Sexual Activity: Not on file   Other Topics Concern  . None   Social History Narrative    Past Psychiatric History: Hospitalizations:  Outpatient Care:  Substance Abuse Care:  Self-Mutilation:  Suicidal Attempts:  Violent Behaviors:   Risk to Self: Is patient at risk for suicide?: Yes What has been your use of drugs/alcohol within the last 12 months?: none Risk to Others:   Prior Inpatient Therapy:   Prior Outpatient Therapy:    Level of Care:  OP  Hospital Course:  The patient was started on Prozac and Abilify. He tolerated these medications well. He ordered an improvement in his mood. He has denied suicidal ideation.  He is forward thinking and discusses wanting to engage in a relationship with a 57 year old son. He has asked Clinical research associate to complete FMLA paperwork. Is been participating in groups. On the day of discharge she was observed in the day room talking with other patients and appeared to be enjoying a television show. He denies any psychotic symptoms.  His admission he did report chronic cough of 2 years. He did have a CT scan which showed a 3 mm nodule in his right middle lobe. Patient was informed of this internal medicine consult was  performed. Consultation recommends follow-up of this within 1 year. Also medicine consult placed in order for him to have outpatient follow-up with pulmonology related to his chronic cough. He was given a trial of Flonase. Internal medicine added a 4 day course of Zithromax and protonix. I recommend continuing the Flonase for 1 month and he follow-up with pulmonary as discussed above  Consults:  Internal medicine  Significant Diagnostic Studies:  radiology: CT scan: Of the chest significant for a 3 mm nodule in the right middle lobe. In addition they noted signs consistent with mild bronchiolitis.  Discharge Vitals:   Blood pressure 115/81, pulse 111, temperature 97.7 F (36.5 C), temperature source Oral, resp. rate 20, height 6' (1.829 m), weight 142.883 kg (315 lb), SpO2 95 %. Body mass index is 42.71 kg/(m^2). Lab Results:   No results found for this or any previous visit (from the past 72 hour(s)).  Physical Findings: AIMS: Facial and Oral Movements Muscles of Facial Expression: None, normal Lips and Perioral Area: None, normal Jaw: None, normal Tongue: None, normal,Extremity Movements Upper (arms, wrists, hands, fingers): None, normal Lower (legs, knees, ankles, toes): None, normal, Trunk Movements Neck, shoulders, hips: None, normal, Overall Severity Severity of abnormal movements (highest score from questions above): None, normal Incapacitation due to abnormal movements: None, normal Patient's awareness of abnormal movements (rate only patient's report): No Awareness, Dental Status Current problems with teeth and/or dentures?: No Does patient usually wear dentures?: No  CIWA:    COWS:      See Psychiatric Specialty Exam and Suicide Risk Assessment completed by Attending Physician prior to discharge.  Discharge destination:  Home  Is patient on multiple antipsychotic therapies at discharge:  No   Has Patient had three or more failed trials of antipsychotic monotherapy by  history:  No    Recommended Plan for Multiple Antipsychotic Therapies: NA  Discharge Instructions    Ambulatory referral to Pulmonology    Complete by:  As directed   Per hospitalist consultation, a referral was made for patient to see Dr. Meredeth Ide for outpatient pulmonary consultation.            Medication List    STOP taking these medications        tobramycin-dexamethasone ophthalmic solution  Commonly known as:  TOBRADEX      TAKE these medications      Indication   ARIPiprazole 5 MG tablet  Commonly known as:  ABILIFY  Take 1 tablet (5 mg total) by mouth daily.   Indication:  Major Depressive Disorder     azithromycin 250 MG tablet  Commonly known as:  ZITHROMAX  One daily for 3 days.   Indication:  bronchiolitis     FLUoxetine 20 MG capsule  Commonly known as:  PROZAC  Take 1 capsule (20 mg total) by mouth daily.      fluticasone 50 MCG/ACT nasal spray  Commonly known as:  FLONASE  Place 1 spray  into both nostrils 2 (two) times daily.      hydrocortisone cream 1 %  Apply topically 2 (two) times daily.      pantoprazole 40 MG tablet  Commonly known as:  PROTONIX  Take 1 tablet (40 mg total) by mouth daily.            Follow-up Information    Follow up with [facility].   Why:  For follow-up care   Contact information:   [address] [place], [place] [address] Ph [phone] Fax      Follow up with Ned Clines, MD In 4 weeks.   Specialty:  Specialist   Why:  chronic cough and lung nodule on CT nonsmoker   Contact information:   [address] [facility] - PULMONOLOGY [place] [address] [phone]       Follow-up recommendations:  Other:  Outpatient mental health treatment  Comments:  Discussed with patient his need to make a follow-up appointment with pulmonology and his need to follow-up on the nodule in 1 year.  Total Discharge Time:30 minutes.  Signed: Wallace Going 04/22/2015, 2:31 PM

## 2015-04-22 NOTE — Progress Notes (Signed)
Assumed care of patient at midnight. No needs or distress noted. Will continue to monitor.  

## 2015-04-22 NOTE — [facility] Suicide Risk Assessment (Signed)
[facility] Discharge Suicide Risk Assessment   Demographic Factors:  Male, Divorced or widowed and Caucasian  Total Time spent with patient: 30 minutes  Musculoskeletal: Strength & Muscle Tone: within normal limits Gait & Station: normal Patient leans: N/A  Psychiatric Specialty Exam: Physical Exam  ROS  Blood pressure 115/81, pulse 111, temperature 97.7 F (36.5 C), temperature source Oral, resp. rate 20, height 6' (1.829 m), weight 142.883 kg (315 lb), SpO2 95 %.Body mass index is 42.71 kg/(m^2).                                                       Have you used any form of tobacco in the last 30 days? (Cigarettes, Smokeless Tobacco, Cigars, and/or Pipes): No  Has this patient used any form of tobacco in the last 30 days? (Cigarettes, Smokeless Tobacco, Cigars, and/or Pipes) No  Mental Status Per Nursing Assessment::   On Admission:     Current Mental Status by Physician: No suicidal ideation. See Discharge summary for addtional MSE  Loss Factors: Loss of significant relationship  Historical Factors: No prior attmepts., no substance use issues.  Risk Reduction Factors:   Sense of responsibility to family, Positive social support and Positive coping skills or problem solving skills  Continued Clinical Symptoms:  Depression:   Anhedonia  Cognitive Features That Contribute To Risk:  None    Suicide Risk:  Mild:  Suicidal ideation of limited frequency, intensity, duration, and specificity.  There are no identifiable plans, no associated intent, mild dysphoria and related symptoms, good self-control (both objective and subjective assessment), few other risk factors, and identifiable protective factors, including available and accessible social support.  Principal Problem: Severe major depression with psychotic features, mood-congruent Discharge Diagnoses:  Patient Active Problem List   Diagnosis Date Noted  . Severe major depression with psychotic  features, mood-congruent [F32.3] 04/11/2015  . Suicidal ideation [R45.851] 04/11/2015  . Sleep apnea [G47.30] 04/11/2015    Follow-up Information    Follow up with [facility] Medicine.   Why:  For follow-up care   Contact information:   [address] [place], [place] [address] Ph [phone] Fax      Follow up with Ned Clines, MD In 4 weeks.   Specialty:  Specialist   Why:  chronic cough and lung nodule on CT nonsmoker   Contact information:   [address] [facility] [place] - PULMONOLOGY [place] [address] [phone]       Plan Of Care/Follow-up recommendations:  Other:  Outpatient treatment.   Is patient on multiple antipsychotic therapies at discharge:  No   Has Patient had three or more failed trials of antipsychotic monotherapy by history:  No  Recommended Plan for Multiple Antipsychotic Therapies: NA    Wallace Going 04/22/2015, 2:27 PM

## 2015-04-22 NOTE — Plan of Care (Signed)
Problem: Consults Goal: Depression Patient Education See Patient Education Module for education specifics.  Outcome: Progressing Patient reports that he is feeling better, denying suicidal and homicidal thoughts. Interacting appropriately with staff and peers and expressing his thoughts as needed.

## 2015-04-22 NOTE — [facility] Group Notes (Signed)
[facility] Group Notes:  (Nursing/MHT/Case Management/Adjunct)  Date:  04/22/2015  Time:  4:01 AM  Type of Therapy:  Group Therapy  Participation Level:  Active  Participation Quality:  Appropriate  Affect:  Appropriate  Cognitive:  Appropriate  Insight:  Good  Engagement in Group:  Engaged  Modes of Intervention:  n/a  Summary of Progress/Problems:  Veva Holes 04/22/2015, 4:01 AM

## 2015-04-22 NOTE — Progress Notes (Signed)
[facility] MD Progress Note  04/22/2015 11:55 AM Jesse Pearson  MRN:  [id number]  Subjective: Jesse Pearson he indicates he is now focused on trying to connect with a 43 year old son. He states his 68 year-old son is aware he is in the hospital but does not know the details such as him being in the mental health part of the hospital. He feels the medication is working. He denies any physical issues aside from a cough. He was seen by medicine consult to have arrange for some pulmonary follow-up as an outpatient. They've also started treatment for GERD, antibiotics and recommended he continue on the Flonase for another month.  Principal Problem: Severe major depression with psychotic features, mood-congruent Diagnosis:   Patient Active Problem List   Diagnosis Date Noted  . 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 Suicidal ideation [R45.851] 04/11/2015  . Sleep apnea [G47.30] 04/11/2015   Total Time spent with patient: 20 minutes   Past Medical History:  Past Medical History  Diagnosis Date  . Sleep apnea   . Anxiety     Past Surgical History  Procedure Laterality Date  . Appendectomy     Family History: History reviewed. No pertinent family history. Social History:  History  Alcohol Use No     History  Drug Use Not on file    History   Social History  . Marital Status: Married    Spouse Name: N/A  . Number of Children: N/A  . Years of Education: N/A   Social History Main Topics  . Smoking status: Never Smoker   . Smokeless tobacco: Not on file  . Alcohol Use: No  . Drug Use: Not on file  . Sexual Activity: Not on file   Other Topics Concern  . None   Social History Narrative   Additional History:    Sleep: Good  Appetite:  Good   Assessment:   Musculoskeletal: Strength & Muscle Tone: within normal limits Gait & Station: normal Patient leans: N/A   Psychiatric Specialty Exam: Physical Exam  Nursing note  and vitals reviewed. Constitutional: He appears well-developed and well-nourished.  HENT:  Head: Normocephalic and atraumatic.  Eyes: Conjunctivae are normal. Pupils are equal, round, and reactive to light.  Neck: Normal range of motion.  Cardiovascular: Normal heart sounds.   Respiratory: Effort normal.  GI: Soft.  Musculoskeletal: Normal range of motion.  Neurological: He is alert.  Skin: Skin is warm and dry. Rash noted. There is erythema.  Psychiatric: His speech is normal and behavior is normal. Judgment normal. His mood appears anxious. Cognition and memory are normal. He exhibits a depressed mood. He expresses suicidal ideation.    Review of Systems  Constitutional: Negative.   HENT: Negative.   Eyes: Negative.   Respiratory: Negative.   Cardiovascular: Negative.   Gastrointestinal: Negative.   Musculoskeletal: Negative.   Skin: Negative.   Neurological: Negative.   Psychiatric/Behavioral: Positive for depression, suicidal ideas and hallucinations. Negative for substance abuse. The patient is nervous/anxious and has insomnia.   All other systems reviewed and are negative.   Blood pressure 115/81, pulse 111, temperature 97.7 F (36.5 C), temperature source Oral, resp. rate 20, height 6' (1.829 m), weight 142.883 kg (315 lb), SpO2 95 %.Body mass index is 42.71 kg/(m^2).  General Appearance: Casual  Eye Contact::  Fair  Speech:  Slow  Volume:  Decreased  Mood:  Depressed and better  Affect:  Restricted  Thought Process:  Goal Directed and Linear  Orientation:  Full (Time, Place, and Person)  Thought Content:  Negative  Suicidal Thoughts:  No  Homicidal Thoughts:  No  Memory:  Immediate;   Fair Recent;   Fair Remote;   Fair  Judgement:  Fair  Insight:  Fair  Psychomotor Activity:  Normal  Concentration:  Fair  Recall:  [employer] of Knowledge:Fair  Language: Fair  Akathisia:  No  Handed:  Right  AIMS (if indicated):     Assets:  Communication Skills Desire for  Improvement Financial Resources/Insurance Housing Physical Health Social Support  ADL's:  Intact  Cognition: WNL  Sleep:  Number of Hours: 7.25     Current Medications: Current Facility-Administered Medications  Medication Dose Route Frequency Provider Last Rate Last Dose  . acetaminophen (TYLENOL) tablet 650 mg  650 mg Oral Q6H PRN Audery Amel, MD      . alum & mag hydroxide-simeth (MAALOX/MYLANTA) 200-200-20 MG/5ML suspension 30 mL  30 mL Oral Q4H PRN Audery Amel, MD      . ARIPiprazole (ABILIFY) tablet 5 mg  5 mg Oral Daily Jolanta B Pucilowska, MD   5 mg at 04/22/15 0924  . azithromycin (ZITHROMAX) tablet 250 mg  250 mg Oral Daily Adrian Saran, MD   250 mg at 04/22/15 0924  . FLUoxetine (PROZAC) capsule 20 mg  20 mg Oral Daily Audery Amel, MD   20 mg at 04/22/15 0924  . fluticasone (FLONASE) 50 MCG/ACT nasal spray 1 spray  1 spray Each Nare BID Kerin Salen, MD   1 spray at 04/22/15 1610  . hydrocortisone cream 1 %   Topical BID Audery Amel, MD      . magnesium hydroxide (MILK OF MAGNESIA) suspension 30 mL  30 mL Oral Daily PRN Audery Amel, MD      . pantoprazole (PROTONIX) EC tablet 40 mg  40 mg Oral Daily Adrian Saran, MD   40 mg at 04/22/15 9604    Lab Results: No results found for this or any previous visit (from the past 48 hour(s)).  Physical Findings: AIMS: Facial and Oral Movements Muscles of Facial Expression: None, normal Lips and Perioral Area: None, normal Jaw: None, normal Tongue: None, normal,Extremity Movements Upper (arms, wrists, hands, fingers): None, normal Lower (legs, knees, ankles, toes): None, normal, Trunk Movements Neck, shoulders, hips: None, normal, Overall Severity Severity of abnormal movements (highest score from questions above): None, normal Incapacitation due to abnormal movements: None, normal Patient's awareness of abnormal movements (rate only patient's report): No Awareness, Dental Status Current problems with teeth and/or  dentures?: No Does patient usually wear dentures?: No  CIWA:    COWS:     Treatment Plan Summary: Daily contact with patient to assess and evaluate symptoms and progress in treatment and Medication management   Medical Decision Making:  Established Problem, Stable/Improving (1), Review of Psycho-Social Stressors (1), Review or order clinical lab tests (1), Review of Medication Regimen & Side Effects (2) and Review of New Medication or Change in Dosage (2)   Jesse Pearson is a 44 year old male with no past psychiatric history admitted for suicidal ideation with a plan in the context of marital problems.  1. Suicidal ideation. No intent or plan. He is able to contract for safety in the hospital. He is on 15 minutes checks.  2. Mood. He was started on Prozac and Abilify for depression. Port some improvement in his mood but as discussed above indicates he feels "50%" better. . 3. Insomnia. He  experiences no sleeping problems in the hospital   4. Productive cough. CT of the chest results indicate 3 mm nodule in the right middle lobe and some bronchiolitis. Singh consult recommends continuing the Flonase for another month. They have arranged for outpatient pulmonary follow-up. They have also started a PPI for possible reflux related cough and Zithromax.  5. Disposition. He will be discharged to home he will follow up with one of the local providers.    Wallace Going 04/22/2015, 11:55 AM

## 2015-04-22 NOTE — Progress Notes (Signed)
  [facility] Adult Case Management Discharge Plan :  Will you be returning to the same living situation after discharge:  Yes,  home At discharge, do you have transportation home?: Yes,  sister Lawernce Ion will pick up  Do you have the ability to pay for your medications: Yes,  patient has [employer]  Release of information consent forms completed and in the chart;  Patient's signature needed at discharge.  Patient to Follow up at: Follow-up Information    Follow up with [facility]. Go in 27 days.   Why:  For follow-up care; May 19, 2015 at 12pm is earliest available   Contact information:   [address] [place], [place] [address] Ph [phone] Fax [phone]      Follow up with Ned Clines, MD In 4 weeks.   Specialty:  Specialist   Why:  chronic cough and lung nodule on CT nonsmoker   Contact information:   [address] [facility] - PULMONOLOGY [place] [address] [phone]       Follow up with [facility] [place], Letta Pate, MD. Schedule an appointment as soon as possible for a visit in 1 week.   Specialty:  Internal Medicine   Why:  follow up cough   Contact information:   [address] [place] [address] [phone]       Patient denies SI/HI: Yes,  patient deneis SI/HI    Safety Planning and Suicide Prevention discussed: Yes,  SPE provided to patient and his sister and patient is given SPE brochure  Have you used any form of tobacco in the last 30 days? (Cigarettes, Smokeless Tobacco, Cigars, and/or Pipes): No  Has patient been referred to the Quitline?: N/A patient is not a smoker  Beryl Meager T 04/22/2015, 3:55 PM

## 2015-04-22 NOTE — Progress Notes (Signed)
Recreation Therapy Notes  Date: 06.24.16 Time: 3:00 pm Location: Craft Room  Group Topic: Problem Solving, Communication, Teamwork  Goal Area(s) Addresses:  Patient will work in teams towards shared goal. Patient will verbalize skills needed to make activity successful. Patient will verbalize benefit of using skills identified to reach post d/c goals.  Behavioral Response: Did not attend  Intervention: Landing Pad  Activity: Patients were given 15 straws and approximately 2.5 feet of tape and instructed to build a landing pad to catch a golf ball.  Education: LRT educated patients on why communication, teamwork, and problem solving are important.  Education Outcome: Patient did not attend group.  Clinical Observations/Feedback: Patient did not attend group.  Jacquelynn Cree, LRT/CTRS 04/22/2015 5:03 PM

## 2015-04-22 NOTE — [facility] Group Notes (Signed)
[facility] LCSW Group Therapy  04/22/2015 4:00 PM  Type of Therapy:  Group Therapy  Participation Level:  Did Not Attend  Modes of Intervention:  Discussion, Education, Problem-solving, Socialization and Support  Summary of Progress/Problems:Feelings around relapse and recovery: Pt will discuss emotions they experience before and after a relapse. Pt will be encouraged to explore feelings around recovery.    Rondall Allegra, MSW, LCSWA  04/22/2015, 4:00 PM

## 2015-04-22 NOTE — [facility] Group Notes (Signed)
[facility] LCSW Aftercare Discharge Planning Group Note  04/22/2015 10:48 AM  Participation Quality:  Appropriate and Attentive  Affect:  Lethargic  Cognitive:  Alert, Appropriate and Oriented  Insight:  Improving  Engagement in Group:  Lacking  Modes of Intervention:  Education, Socialization and Support  Summary of Progress/Problems: Patient attended and participated in group appropriately. Patient shared that his SMART goal is to "try to talk to my son today on the phone".   Beryl Meager T 04/22/2015, 10:48 AM

## 2015-04-22 NOTE — [facility] Group Notes (Signed)
[facility] Group Notes:  (Nursing/MHT/Case Management/Adjunct)  Date:  04/22/2015  Time:  11:41 AM  Type of Therapy:  Group Therapy  Participation Level:  Active  Participation Quality:  Appropriate, Attentive and Sharing  Affect:  Appropriate  Cognitive:  Alert, Appropriate and Oriented  Insight:  Improving  Engagement in Group:  Improving  Modes of Intervention:  Activity  Summary of Progress/Problems:  Jesse Pearson Jesse Pearson Jesse Pearson 04/22/2015, 11:41 AM

## 2015-04-22 NOTE — Progress Notes (Signed)
Recreation Therapy Notes  INPATIENT RECREATION TR PLAN  Patient Details Name: DENNIES COATE MRN: [id number] DOB: 01/21/1971 Today's Date: 04/22/2015  Rec Therapy Plan Is patient appropriate for Therapeutic Recreation?: Yes Treatment times per week: At least 3 times a week TR Treatment/Interventions: 1:1 session, Group participation (Comment) (Appropriate participation in daily recreation therapy tx)  Discharge Criteria Pt will be discharged from therapy if:: Discharged Treatment plan/goals/alternatives discussed and agreed upon by:: Patient/family  Discharge Summary Short term goals set: See Care Plan Short term goals met: Complete Progress toward goals comments: One-to-one attended Which groups?: Self-esteem, Coping skills, Leisure education, Goal setting, Other (Comment) (Self-expression) One-to-one attended: Self-esteem, stress management Reason goals not met: N/A Therapeutic equipment acquired: None Reason patient discharged from therapy: Discharge from hospital Pt/family agrees with progress & goals achieved: Yes Date patient discharged from therapy: 04/22/15   Leonette Monarch, LRT/CTRS 04/22/2015, 6:11 PM

## 2015-04-22 NOTE — Progress Notes (Signed)
[facility] [place] [facility]   PATIENT NAME: Jesse Pearson    MR#:  [id number]  DATE OF BIRTH:  [birth year]  SUBJECTIVE:  Patient reports that his cough is doing a little bit better.  REVIEW OF SYSTEMS:    Review of Systems  Constitutional: Negative for fever, chills and malaise/fatigue.  HENT: Negative for sore throat.   Eyes: Negative for blurred vision.  Respiratory: Positive for cough. Negative for hemoptysis, shortness of breath and wheezing.   Cardiovascular: Negative for chest pain, palpitations and leg swelling.  Gastrointestinal: Negative for nausea, vomiting, abdominal pain, diarrhea and blood in stool.  Genitourinary: Negative for dysuria.  Musculoskeletal: Negative for back pain.  Neurological: Negative for dizziness, tremors and headaches.  Endo/Heme/Allergies: Does not bruise/bleed easily.  Psychiatric/Behavioral: Positive for depression and suicidal ideas.    Tolerating Diet:yes      DRUG ALLERGIES:   Allergies  Allergen Reactions  . Cephalosporins Other (See Comments)    Reaction:  Unknown   . Clindamycin/Lincomycin Other (See Comments)    Reaction:  Unknown     VITALS:  Blood pressure 115/81, pulse 111, temperature 97.7 F (36.5 C), temperature source Oral, resp. rate 20, height 6' (1.829 m), weight 142.883 kg (315 lb), SpO2 95 %.  PHYSICAL EXAMINATION:   Physical Exam  Constitutional: He is oriented to person, place, and time and well-developed, well-nourished, and in no distress. No distress.  HENT:  Head: Normocephalic.  Eyes: No scleral icterus.  Neck: Normal range of motion. Neck supple. No JVD present. No tracheal deviation present.  Cardiovascular: Normal rate, regular rhythm and normal heart sounds.  Exam reveals no gallop and no friction rub.   No murmur heard. Pulmonary/Chest: Effort normal and breath sounds normal. No respiratory distress. He has no wheezes. He has no rales. He exhibits no  tenderness.  Abdominal: Soft. Bowel sounds are normal. He exhibits no distension and no mass. There is no tenderness. There is no rebound and no guarding.  Musculoskeletal: Normal range of motion. He exhibits no edema.  Neurological: He is alert and oriented to person, place, and time.  Skin: Skin is warm. No rash noted. No erythema.  Psychiatric: Affect normal.      LABORATORY PANEL:   CBC No results for input(s): WBC, HGB, HCT, PLT in the last 168 hours. ------------------------------------------------------------------------------------------------------------------  Chemistries  No results for input(s): NA, K, CL, CO2, GLUCOSE, BUN, CREATININE, CALCIUM, MG, AST, ALT, ALKPHOS, BILITOT in the last 168 hours.  Invalid input(s): GFRCGP ------------------------------------------------------------------------------------------------------------------  Cardiac Enzymes No results for input(s): TROPONINI in the last 168 hours. ------------------------------------------------------------------------------------------------------------------  RADIOLOGY:  Ct Chest Wo Contrast  04/20/2015    IMPRESSION: 1. 3 mm right middle lobe nodule. If the patient is at high risk for bronchogenic carcinoma, follow-up chest CT at 1 year is recommended. If the patient is at low risk, no follow-up is needed. This recommendation follows the consensus statement: Guidelines for Management of Small Pulmonary Nodules Detected on CT Scans: A Statement from the Fleischner Society as published in Radiology 2005; 237:395-400. 2. Minimal cylindrical bronchiectasis and airway thickening in the lower lobes, with some faint reticulonodular peripheral and interstitial accentuation potentially reflecting low grade atypical infectious bronchiolitis.   Electronically Signed   By: Gaylyn Rong M.D.   On: 04/20/2015 16:53     ASSESSMENT AND PLAN:   44 year old male who is admitted to behavioral health for depression who  endorses chronic cough for over 2 years.  1. Chronic cough: This is  Jesse Pearson long-standing issue. He reports that his symptoms are slowly improving. I suggest that we continue the plan as outlined in my consultation. He will continue on PPI and Flonase for one month. He will have follow-up with the pulmonologist if the symptoms persist to evaluate for adult-onset asthma. He should also continue azithromycin as prescribed.  2. Lung nodule: Patient does not smoke and will need a CT scan in one year. Patient will need to follow-up with the pulmonologist. I suggest that this appointment be made prior to discharge. I have already written the order for follow-up with Dr. Meredeth Ide.  3. Depression as per psychiatry  Or. Objective sleep apnea: Patient should use his CPAP at night. I have discussed this with the patient.    Management plans discussed with the patient and he is in agreement.  CODE STATUS: full  TOTAL TIME TAKING CARE OF THIS PATIENT: 21 minutes.   Greater than 50% counseling and coordination of care  I will sign off. Please call me if you have any questions thank you   Yarely Bebee M.D on 04/22/2015 at 12:18 PM  Between 7am to 6pm - Pager - [phone] After 6pm go to [email or web address] - password [email or web address] [facility]  [facility]  Office  [phone]  CC: Primary care physician; Rafael Bihari, MD

## 2015-04-22 NOTE — Progress Notes (Signed)
Rated his depression 5/10.Denies active suicidal ideation.Contracts for safety.Minimal interaction with staff & peers.Compliant with meds.Attended groups.

## 2015-04-22 NOTE — Tx Team (Signed)
Interdisciplinary Treatment Plan Update (Adult)  Date:  04/22/2015 Time Reviewed:  11:20 AM  Progress in Treatment: Attending groups: Yes. Participating in groups:  Yes. Taking medication as prescribed:  Yes. Tolerating medication:  Yes. Family/Significant othe contact made:  No, will contact:  patient's sister Patient understands diagnosis:  Yes. Discussing patient identified problems/goals with staff:  Yes. Medical problems stabilized or resolved:  Yes. Denies suicidal/homicidal ideation: Yes. Issues/concerns per patient self-inventory:  No. Other:  New problem(s) identified: No, Describe:  none reported  Discharge Plan or Barriers: Patient referred to [facility] as patient has been hospitalized beyond 5 days. Patient plans to discharge home and follow up with [facility] in [place].   Reason for Continuation of Hospitalization: Depression Suicidal ideation  Comments: Patient is feeling slightly better but hesitant to go home to the enviroment alone as his wife separated 2 months ago.   Estimated length of stay: up to 4 days  New goal(s):  Review of initial/current patient goals per problem list:   See Care Plan  Attendees: Physician:  Wallace Going, MD 6/24/201611:20 AM  Nursing:    6/24/201611:20 AM  Other:  Beryl Meager, LCSWA 6/24/201611:20 AM  Other:  Jake Shark, LCSW 6/24/201611:20 AM  Other:  Hershal Coria, LRT 6/24/201611:20 AM  Other: Carola Frost, PsychD.  6/24/201611:20 AM  Other:   6/24/201611:20 AM   Scribe for Treatment Team:   Beryl Meager T, 04/22/2015, 11:20 AM

## 2015-04-22 NOTE — [facility] Suicide Risk Assessment (Signed)
[facility] INPATIENT:  Family/Significant Other Suicide Prevention Education  Suicide Prevention Education:  Education Completed; Lawernce Ion (sister) [phone] has been identified by the patient as the family member/significant other with whom the patient will be residing, and identified as the person(s) who will aid the patient in the event of a mental health crisis (suicidal ideations/suicide attempt).  With written consent from the patient, the family member/significant other has been provided the following suicide prevention education, prior to the and/or following the discharge of the patient.  The suicide prevention education provided includes the following:  Suicide risk factors  Suicide prevention and interventions  National Suicide Hotline telephone number  [facility] assessment telephone number  [facility] Emergency Assistance 911  [facility] and/or Residential Mobile Crisis Unit telephone number  Request made of family/significant other to:  Remove weapons (e.g., guns, rifles, knives), all items previously/currently identified as safety concern.    Remove drugs/medications (over-the-counter, prescriptions, illicit drugs), all items previously/currently identified as a safety concern.  The family member/significant other verbalizes understanding of the suicide prevention education information provided.  The family member/significant other agrees to remove the items of safety concern listed above.  Beryl Meager T 04/22/2015, 3:32 PM

## 2015-04-25 NOTE — Progress Notes (Signed)
AVS H&P Discharge Summary faxed to [facility]

## 2015-05-19 ENCOUNTER — Ambulatory Visit (INDEPENDENT_AMBULATORY_CARE_PROVIDER_SITE_OTHER): Payer: PRIVATE HEALTH INSURANCE | Admitting: Psychology

## 2015-05-19 DIAGNOSIS — F331 Major depressive disorder, recurrent, moderate: Secondary | ICD-10-CM

## 2015-05-25 ENCOUNTER — Encounter: Payer: Self-pay | Admitting: *Deleted

## 2015-05-25 ENCOUNTER — Emergency Department
Admission: EM | Admit: 2015-05-25 | Discharge: 2015-05-26 | Disposition: A | Discharge status: Other Acute Inpatient Hospital | Payer: PRIVATE HEALTH INSURANCE | Attending: Student | Admitting: Student

## 2015-05-25 DIAGNOSIS — F333 Major depressive disorder, recurrent, severe with psychotic symptoms: Secondary | ICD-10-CM | POA: Diagnosis not present

## 2015-05-25 DIAGNOSIS — Z7952 Long term (current) use of systemic steroids: Secondary | ICD-10-CM | POA: Diagnosis not present

## 2015-05-25 DIAGNOSIS — Z79899 Other long term (current) drug therapy: Secondary | ICD-10-CM | POA: Diagnosis not present

## 2015-05-25 DIAGNOSIS — F323 Major depressive disorder, single episode, severe with psychotic features: Secondary | ICD-10-CM | POA: Diagnosis present

## 2015-05-25 DIAGNOSIS — Z7951 Long term (current) use of inhaled steroids: Secondary | ICD-10-CM | POA: Diagnosis not present

## 2015-05-25 DIAGNOSIS — G4730 Sleep apnea, unspecified: Secondary | ICD-10-CM | POA: Diagnosis present

## 2015-05-25 DIAGNOSIS — R45851 Suicidal ideations: Secondary | ICD-10-CM

## 2015-05-25 DIAGNOSIS — Z008 Encounter for other general examination: Secondary | ICD-10-CM | POA: Diagnosis present

## 2015-05-25 LAB — COMPREHENSIVE METABOLIC PANEL
ALT: 25 U/L (ref 17–63)
ANION GAP: 8 (ref 5–15)
AST: 19 U/L (ref 15–41)
Albumin: 4.4 g/dL (ref 3.5–5.0)
Alkaline Phosphatase: 79 U/L (ref 38–126)
BILIRUBIN TOTAL: 0.9 mg/dL (ref 0.3–1.2)
BUN: 13 mg/dL (ref 6–20)
CHLORIDE: 104 mmol/L (ref 101–111)
CO2: 27 mmol/L (ref 22–32)
CREATININE: 1.01 mg/dL (ref 0.61–1.24)
Calcium: 9.3 mg/dL (ref 8.9–10.3)
GFR calc non Af Amer: >60 mL/min (ref >60)
GLUCOSE: 144 mg/dL — AB (ref 65–99)
Potassium: 3.4 mmol/L — ABNORMAL LOW (ref 3.5–5.1)
SODIUM: 139 mmol/L (ref 135–145)
TOTAL PROTEIN: 7.9 g/dL (ref 6.5–8.1)

## 2015-05-25 LAB — ACETAMINOPHEN LEVEL

## 2015-05-25 LAB — CBC
HEMATOCRIT: 50.8 % (ref 40.0–52.0)
Hemoglobin: 17.2 g/dL (ref 13.0–18.0)
MCH: 30.8 pg (ref 26.0–34.0)
MCHC: 33.9 g/dL (ref 32.0–36.0)
MCV: 90.9 fL (ref 80.0–100.0)
Platelets: 203 10*3/uL (ref 150–440)
RBC: 5.6 MIL/uL (ref 4.40–5.90)
RDW: 13.8 % (ref 11.5–14.5)
WBC: 9.9 10*3/uL (ref 3.8–10.6)

## 2015-05-25 LAB — URINE DRUG SCREEN, QUALITATIVE (ARMC ONLY)
Amphetamines, Ur Screen: NOT DETECTED
BARBITURATES, UR SCREEN: NOT DETECTED
Benzodiazepine, Ur Scrn: NOT DETECTED
CANNABINOID 50 NG, UR ~~LOC~~: NOT DETECTED
Cocaine Metabolite,Ur ~~LOC~~: NOT DETECTED
MDMA (Ecstasy)Ur Screen: NOT DETECTED
Methadone Scn, Ur: NOT DETECTED
Opiate, Ur Screen: NOT DETECTED
Phencyclidine (PCP) Ur S: NOT DETECTED
Tricyclic, Ur Screen: NOT DETECTED

## 2015-05-25 LAB — SALICYLATE LEVEL: Salicylate Lvl: <4 mg/dL (ref 2.8–30.0)

## 2015-05-25 LAB — ETHANOL: Alcohol, Ethyl (B): <5 mg/dL (ref <5)

## 2015-05-25 NOTE — ED Notes (Signed)
BEHAVIORAL HEALTH ROUNDING Patient sleeping: No. Patient alert and oriented: yes Behavior appropriate: Yes.  ; If no, describe:  Nutrition and fluids offered: Yes  Toileting and hygiene offered: Yes  Sitter present: yes Law enforcement present: Yes  

## 2015-05-25 NOTE — ED Notes (Signed)
Pt observed sitting in the dayroom in a recliner - laughing with other pt's and watching Tv  Pt observed with no unusual behavior  Appropriate to stimulation  No verbalized needs or concerns at this time  NAD assessed  Continue to monitor

## 2015-05-25 NOTE — ED Notes (Signed)

## 2015-05-25 NOTE — ED Notes (Signed)
Pt transferred into ED BHU room 2   Patient assigned to appropriate care area. Patient oriented to unit/care area: Informed that, for their safety, care areas are designed for safety and monitored by security cameras at all times; Visiting hours and phone times explained to patient. Patient verbalizes understanding, and verbal contract for safety obtained.     

## 2015-05-25 NOTE — ED Notes (Signed)
D/C 1:1 sitter per Dr.Gayle

## 2015-05-25 NOTE — ED Notes (Signed)
ED BHU PLACEMENT JUSTIFICATION Is the patient under IVC or is there intent for IVC: no.   Is the patient medically cleared: Yes.   Is there vacancy in the ED BHU: Yes.   Is the population mix appropriate for patient: Yes.   Is the patient awaiting placement in inpatient or outpatient setting: Yes.  LL BMU Has the patient had a psychiatric consult: Yes.   Survey of unit performed for contraband, proper placement and condition of furniture, tampering with fixtures in bathroom, shower, and each patient room: Yes.  ; Findings:  APPEARANCE/BEHAVIOR Calm and cooperative NEURO ASSESSMENT Orientation: oriented x3  Denies pain Hallucinations: No.None noted (Hallucinations) Speech: Normal Gait: normal RESPIRATORY ASSESSMENT Even  Unlabored respirations  CARDIOVASCULAR ASSESSMENT Pulses equal   regular rate  Skin warm and dry   GASTROINTESTINAL ASSESSMENT no GI complaint EXTREMITIES Full ROM  PLAN OF CARE Provide calm/safe environment. Vital signs assessed twice daily. ED BHU Assessment once each 12-hour shift. Collaborate with intake RN daily or as condition indicates. Assure the ED provider has rounded once each shift. Provide and encourage hygiene. Provide redirection as needed. Assess for escalating behavior; address immediately and inform ED provider.  Assess family dynamic and appropriateness for visitation as needed: Yes.  ; If necessary, describe findings:  Educate the patient/family about BHU procedures/visitation: Yes.  ; If necessary, describe findings:

## 2015-05-25 NOTE — Consult Note (Signed)
[facility] Consult   Reason for Consult:  Consult for this 44 year old man with a history of recent depression who comes voluntarily to the emergency room with a chief complaint of being suicidal Referring Physician:  Edd Fabian Patient Identification: Jesse Pearson MRN:  [id number] Principal Diagnosis: Severe major depression with psychotic features, mood-congruent Diagnosis:   Patient Active Problem List   Diagnosis Date Noted  . 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 Suicidal ideation [R45.851] 04/11/2015  . Sleep apnea [G47.30] 04/11/2015    Total Time spent with patient: 1 hour  Subjective:   Jesse Pearson is a 44 y.o. male patient admitted with "I've been under a lot of stress".  HPI:  Patient has been out of the hospital now for a few weeks. He has been compliant with his psychiatric medicine. His mood had been stable briefly but has gotten worse for the last week or so. When suddenly that is the same time in which she has been by himself and his son has been back day with his mother. Patient admits that that might be part of the change. He does almost nothing but watch TV during the day. He sleeps poorly at night. Has chaotic sleep patterns. He started to have auditory hallucinations hearing voices which she describes as "an inner voice" telling him to kill himself. He also has been having intrusive thoughts of driving his car off the road or cutting himself. He says he has not been drinking or using any drugs. He went to [facility] to try and see his doctor and just saw an intake person but not a psychiatrist yet  Past psychiatric history: Patient was admitted to our hospital earlier in the summer with his first hospitalization with severe depression. Similar symptoms. Was treated with fluoxetine and Abilify. Partial improvement. No actual suicide attempts in the past.  Medical history: Patient is overweight. He has a  diffuse rash on his face. Has nasal congestion. No other significant ongoing medical problems.  Social history: Currently living by himself. He's been out of work for months. His wife left him months ago. He does almost nothing and feels like he has no support during the day.  Substance abuse history: Denies current or past alcohol or drug abuse.  Medication Abilify 2.5 mg per day Prozac 40 mg per day HPI Elements:   Quality:  Depression with suicidal thoughts. Severity:  Severe potentially life threatening. Timing:  Getting worse for the past week. Duration:  Chronic ongoing problem. Context:  Being more isolated and alone at home. Not having active therapy..  Past Medical History:  Past Medical History  Diagnosis Date  . Sleep apnea   . Anxiety     Past Surgical History  Procedure Laterality Date  . Appendectomy     Family History: No family history on file. Social History:  History  Alcohol Use No     History  Drug Use No    History   Social History  . Marital Status: Married    Spouse Name: N/A  . Number of Children: N/A  . Years of Education: N/A   Social History Main Topics  . Smoking status: Never Smoker   . Smokeless tobacco: Not on file  . Alcohol Use: No  . Drug Use: No  . Sexual Activity: Not on file   Other Topics Concern  . None   Social History Narrative   Additional Social History:  Allergies:   Allergies  Allergen Reactions  . Cephalosporins Other (See Comments)    Reaction:  Unknown   . Clindamycin/Lincomycin Other (See Comments)    Reaction:  Unknown     Labs:  Results for orders placed or performed during the hospital encounter of 05/25/15 (from the past 48 hour(s))  Comprehensive metabolic panel     Status: Abnormal   Collection Time: 05/25/15  3:34 PM  Result Value Ref Range   Sodium 139 135 - 145 mmol/L   Potassium 3.4 (L) 3.5 - 5.1 mmol/L   Chloride 104 101 - 111 mmol/L   CO2 27 22 - 32  mmol/L   Glucose, Bld 144 (H) 65 - 99 mg/dL   BUN 13 6 - 20 mg/dL   Creatinine, Ser 1.01 0.61 - 1.24 mg/dL   Calcium 9.3 8.9 - 10.3 mg/dL   Total Protein 7.9 6.5 - 8.1 g/dL   Albumin 4.4 3.5 - 5.0 g/dL   AST 19 15 - 41 U/L   ALT 25 17 - 63 U/L   Alkaline Phosphatase 79 38 - 126 U/L   Total Bilirubin 0.9 0.3 - 1.2 mg/dL   GFR calc non Af Amer >60 >60 mL/min   GFR calc Af Amer >60 >60 mL/min    Comment: (NOTE) The eGFR has been calculated using the CKD EPI equation. This calculation has not been validated in all clinical situations. eGFR's persistently <60 mL/min signify possible Chronic Kidney Disease.    Anion gap 8 5 - 15  Ethanol (ETOH)     Status: None   Collection Time: 05/25/15  3:34 PM  Result Value Ref Range   Alcohol, Ethyl (B) <5 <5 mg/dL    Comment:        LOWEST DETECTABLE LIMIT FOR SERUM ALCOHOL IS 5 mg/dL FOR MEDICAL PURPOSES ONLY   Salicylate level     Status: None   Collection Time: 05/25/15  3:34 PM  Result Value Ref Range   Salicylate Lvl <2.4 2.8 - 30.0 mg/dL  Acetaminophen level     Status: Abnormal   Collection Time: 05/25/15  3:34 PM  Result Value Ref Range   Acetaminophen (Tylenol), Serum <10 (L) 10 - 30 ug/mL    Comment:        THERAPEUTIC CONCENTRATIONS VARY SIGNIFICANTLY. A RANGE OF 10-30 ug/mL MAY BE AN EFFECTIVE CONCENTRATION FOR MANY PATIENTS. HOWEVER, SOME ARE BEST TREATED AT CONCENTRATIONS OUTSIDE THIS RANGE. ACETAMINOPHEN CONCENTRATIONS >150 ug/mL AT 4 HOURS AFTER INGESTION AND >50 ug/mL AT 12 HOURS AFTER INGESTION ARE OFTEN ASSOCIATED WITH TOXIC REACTIONS.   CBC     Status: None   Collection Time: 05/25/15  3:34 PM  Result Value Ref Range   WBC 9.9 3.8 - 10.6 K/uL   RBC 5.60 4.40 - 5.90 MIL/uL   Hemoglobin 17.2 13.0 - 18.0 g/dL   HCT 50.8 40.0 - 52.0 %   MCV 90.9 80.0 - 100.0 fL   MCH 30.8 26.0 - 34.0 pg   MCHC 33.9 32.0 - 36.0 g/dL   RDW 13.8 11.5 - 14.5 %   Platelets 203 150 - 440 K/uL  Urine Drug Screen, Qualitative  ([facility] only)     Status: None   Collection Time: 05/25/15  3:34 PM  Result Value Ref Range   Tricyclic, Ur Screen NONE DETECTED NONE DETECTED   Amphetamines, Ur Screen NONE DETECTED NONE DETECTED   MDMA (Ecstasy)Ur Screen NONE DETECTED NONE DETECTED   Cocaine Metabolite,Ur [place] NONE DETECTED NONE DETECTED   Opiate,  Ur Screen NONE DETECTED NONE DETECTED   Phencyclidine (PCP) Ur S NONE DETECTED NONE DETECTED   Cannabinoid 50 Ng, Ur [place] NONE DETECTED NONE DETECTED   Barbiturates, Ur Screen NONE DETECTED NONE DETECTED   Benzodiazepine, Ur Scrn NONE DETECTED NONE DETECTED   Methadone Scn, Ur NONE DETECTED NONE DETECTED    Comment: (NOTE) 287  Tricyclics, urine               Cutoff 1000 ng/mL 200  Amphetamines, urine             Cutoff 1000 ng/mL 300  MDMA (Ecstasy), urine           Cutoff 500 ng/mL 400  Cocaine Metabolite, urine       Cutoff 300 ng/mL 500  Opiate, urine                   Cutoff 300 ng/mL 600  Phencyclidine (PCP), urine      Cutoff 25 ng/mL 700  Cannabinoid, urine              Cutoff 50 ng/mL 800  Barbiturates, urine             Cutoff 200 ng/mL 900  Benzodiazepine, urine           Cutoff 200 ng/mL 1000 Methadone, urine                Cutoff 300 ng/mL 1100 1200 The urine drug screen provides only a preliminary, unconfirmed 1300 analytical test result and should not be used for non-medical 1400 purposes. Clinical consideration and professional judgment should 1500 be applied to any positive drug screen result due to possible 1600 interfering substances. A more specific alternate chemical method 1700 must be used in order to obtain a confirmed analytical result.  1800 Gas chromato graphy / mass spectrometry (GC/MS) is the preferred 1900 confirmatory method.     Vitals: Blood pressure 118/83, pulse 88, temperature 98.6 F (37 C), temperature source Oral, resp. rate 18, height 6' (1.829 m), weight 129.275 kg (285 lb), SpO2 94 %.  Risk to Self: Is patient at risk for  suicide?: Yes Risk to Others:   Prior Inpatient Therapy:   Prior Outpatient Therapy:    No current facility-administered medications for this encounter.   Current Outpatient Prescriptions  Medication Sig Dispense Refill  . ARIPiprazole (ABILIFY) 5 MG tablet Take 1 tablet (5 mg total) by mouth daily. 30 tablet 0  . azithromycin (ZITHROMAX) 250 MG tablet One daily for 3 days. 3 each 0  . FLUoxetine (PROZAC) 20 MG capsule Take 1 capsule (20 mg total) by mouth daily. 30 capsule 0  . fluticasone (FLONASE) 50 MCG/ACT nasal spray Place 1 spray into both nostrils 2 (two) times daily. 16 g 0  . hydrocortisone cream 1 % Apply topically 2 (two) times daily. 1.5 g 0  . pantoprazole (PROTONIX) 40 MG tablet Take 1 tablet (40 mg total) by mouth daily. 30 tablet 0    Musculoskeletal: Strength & Muscle Tone: within normal limits Gait & Station: normal Patient leans: N/A  Psychiatric Specialty Exam: Physical Exam  Constitutional: He appears well-developed and well-nourished.  HENT:  Head: Normocephalic and atraumatic.  Eyes: Conjunctivae are normal. Pupils are equal, round, and reactive to light.  Neck: Normal range of motion.  Cardiovascular: Normal heart sounds.   Respiratory: Effort normal.  GI: Soft.  Musculoskeletal: Normal range of motion.  Neurological: He is alert.  Skin: Skin is warm and dry.  Psychiatric: His speech is delayed. He is slowed. Cognition and memory are normal. He expresses impulsivity. He exhibits a depressed mood. He expresses suicidal ideation.  Heavyset somewhat disheveled man. Malodorous. Eye contact good. Psychomotor activity slowed. Patient describes himself as being very depressed and says that he started to hear voices again and had thoughts about killing himself    Review of Systems  Constitutional: Negative.   HENT: Negative.   Eyes: Negative.   Respiratory: Negative.   Cardiovascular: Negative.   Gastrointestinal: Negative.   Musculoskeletal:  Negative.   Skin: Negative.   Neurological: Negative.   Psychiatric/Behavioral: Positive for depression, suicidal ideas and hallucinations. The patient is nervous/anxious and has insomnia.     Blood pressure 118/83, pulse 88, temperature 98.6 F (37 C), temperature source Oral, resp. rate 18, height 6' (1.829 m), weight 129.275 kg (285 lb), SpO2 94 %.Body mass index is 38.64 kg/(m^2).  General Appearance: Disheveled  Eye Sport and exercise psychologist::  Fair  Speech:  Normal Rate  Volume:  Decreased  Mood:  Anxious and Depressed  Affect:  Depressed  Thought Process:  Goal Directed  Orientation:  Full (Time, Place, and Person)  Thought Content:  Hallucinations: Auditory Command:  Suicidal  Suicidal Thoughts:  Yes.  with intent/plan  Homicidal Thoughts:  No  Memory:  Immediate;   Good Recent;   Fair Remote;   Fair  Judgement:  Impaired  Insight:  Shallow  Psychomotor Activity:  Decreased  Concentration:  Poor  Recall:  Poor  Fund of Knowledge:Poor  Language: Fair  Akathisia:  No  Handed:  Right  AIMS (if indicated):     Assets:  Desire for Improvement Physical Health  ADL's:  Intact  Cognition: WNL  Sleep:      Medical Decision Making: Discuss test with performing physician (1), Established Problem, Worsening (2), Review or order medicine tests (1), Review of Medication Regimen & Side Effects (2) and Review of New Medication or Change in Dosage (2)  Treatment Plan Summary: Medication management and Plan Patient is continuing to report suicidal thoughts with active plan. Hallucinations. Very isolated limited social support. Fortunately not abusing drugs. Patient is appropriate to be readmitted to the psychiatric hospital. Orders will be completed and he will be admitted back to the hospital downstairs. Increase Abilify to 5 mg a day. Reviewed plan with the patient who is agreeable.  Plan:  Recommend psychiatric Inpatient admission when medically cleared. Supportive therapy provided about ongoing  stressors. Disposition: Admission orders to be done to admit to psychiatry  Alethia Berthold 05/25/2015 5:54 PM

## 2015-05-25 NOTE — ED Notes (Signed)
Pt states "I am just having a really bad day and I think I need to go back to be admitted to Behavioral Medicine." Pt reports SI, when asked about specific plan- "not really today", but there "are several different plans I have thought of.".

## 2015-05-25 NOTE — ED Notes (Signed)
Patient assigned to appropriate care area. Patient oriented to unit/care area: Informed that, for their safety, care areas are designed for safety and monitored by staff at all times; and visiting hours explained to patient. Patient verbalizes understanding, and verbal contract for safety obtained. 

## 2015-05-25 NOTE — ED Provider Notes (Signed)
[facility] Emergency Department Provider Note  ____________________________________________  Time seen: Approximately 3:54 PM  I have reviewed the triage vital signs and the nursing notes.   HISTORY  Chief Complaint Psychiatric Evaluation    HPI Jesse Pearson is a 44 y.o. male with history of depression with psychotic features, anxiety who presents for evaluation of sudden onset suicidal ideation without specific plan, constant today. Patient reports that he had an argument with someone over his recently canceled disability support. Since that time, he has had suicidal thoughts. He is hearing voices that are telling him to kill himself. He has no specific suicidal plan. No homicidal ideation. No visual hallucinations. Current severity of symptoms is moderate. No other modifying factors. No recent illness include no cough, sneezing, runny nose, congestion, vomiting, diarrhea, fevers or chills.   Past Medical History  Diagnosis Date  . Sleep apnea   . Anxiety     Patient Active Problem List   Diagnosis Date Noted  . Severe major depression with psychotic features, mood-congruent 04/11/2015  . Suicidal ideation 04/11/2015  . Sleep apnea 04/11/2015    Past Surgical History  Procedure Laterality Date  . Appendectomy      Current Outpatient Rx  Name  Route  Sig  Dispense  Refill  . ARIPiprazole (ABILIFY) 5 MG tablet   Oral   Take 1 tablet (5 mg total) by mouth daily.   30 tablet   0   . azithromycin (ZITHROMAX) 250 MG tablet      One daily for 3 days.   3 each   0   . FLUoxetine (PROZAC) 20 MG capsule   Oral   Take 1 capsule (20 mg total) by mouth daily.   30 capsule   0   . fluticasone (FLONASE) 50 MCG/ACT nasal spray   Each Nare   Place 1 spray into both nostrils 2 (two) times daily.   16 g   0   . hydrocortisone cream 1 %   Topical   Apply topically 2 (two) times daily.   1.5 g   0   . pantoprazole (PROTONIX) 40 MG  tablet   Oral   Take 1 tablet (40 mg total) by mouth daily.   30 tablet   0     Allergies Cephalosporins and Clindamycin/lincomycin  No family history on file.  Social History History  Substance Use Topics  . Smoking status: Never Smoker   . Smokeless tobacco: Not on file  . Alcohol Use: No    Review of Systems Constitutional: No fever/chills Eyes: No visual changes. ENT: No sore throat. Cardiovascular: Denies chest pain. Respiratory: Denies shortness of breath. Gastrointestinal: No abdominal pain.  No nausea, no vomiting.  No diarrhea.  No constipation. Genitourinary: Negative for dysuria. Musculoskeletal: Negative for back pain. Skin: Negative for rash. Neurological: Negative for headaches, focal weakness or numbness.  10-point ROS otherwise negative.  ____________________________________________   PHYSICAL EXAM:  VITAL SIGNS: ED Triage Vitals  Enc Vitals Group     BP 05/25/15 1519 118/83 mmHg     Pulse Rate 05/25/15 1519 88     Resp 05/25/15 1519 18     Temp 05/25/15 1519 98.6 F (37 C)     Temp Source 05/25/15 1519 Oral     SpO2 05/25/15 1519 94 %     Weight 05/25/15 1519 285 lb (129.275 kg)     Height 05/25/15 1519 6' (1.829 m)     Head Cir --  Peak Flow --      Pain Score --      Pain Loc --      Pain Edu? --      Excl. in GC? --     Constitutional: Alert and oriented. Well appearing and in no acute distress. Eyes: Conjunctivae are normal. PERRL. EOMI. Head: Atraumatic. Nose: No congestion/rhinnorhea. Mouth/Throat: Mucous membranes are moist.  Oropharynx non-erythematous. Neck: No stridor.   Cardiovascular: Normal rate, regular rhythm. Grossly normal heart sounds.  Good peripheral circulation. Respiratory: Normal respiratory effort.  No retractions. Lungs CTAB. Gastrointestinal: Soft and nontender. No distention. No abdominal bruits. No CVA tenderness. Genitourinary: deferred Musculoskeletal: No lower extremity tenderness nor edema.  No  joint effusions. Neurologic:  Normal speech and language. No gross focal neurologic deficits are appreciated. No gait instability. Skin:  Skin is warm, dry and intact. No rash noted. Psychiatric: Mood and affect are normal. Speech and behavior are normal.  ____________________________________________   LABS (all labs ordered are listed, but only abnormal results are displayed)  Labs Reviewed  COMPREHENSIVE METABOLIC PANEL - Abnormal; Notable for the following:    Potassium 3.4 (*)    Glucose, Bld 144 (*)    All other components within normal limits  CBC  URINE DRUG SCREEN, QUALITATIVE ([facility] ONLY)  ETHANOL  SALICYLATE LEVEL  ACETAMINOPHEN LEVEL   ____________________________________________  EKG  none ____________________________________________  RADIOLOGY  none ____________________________________________   PROCEDURES  Procedure(s) performed: None  Critical Care performed: No  ____________________________________________   INITIAL IMPRESSION / ASSESSMENT AND PLAN / ED COURSE  Pertinent labs & imaging results that were available during my care of the patient were reviewed by me and considered in my medical decision making (see chart for details).  Jesse Pearson is a 44 y.o. male with history of depression with psychotic features, anxiety who presents for evaluation of sudden onset suicidal ideation without specific plan, constant today. On exam, he is very well-appearing and in no acute distress. Vital signs stable, he is afebrile. He reports that he is hearing voices that are telling him to kill himself however he has no specific plan for that. He has never attempted suicide. No HI or visual hallucinations. He is here voluntarily seeking help. No acute medical complaints. Vital signs stable, he is afebrile. Plan for psychiatry consult, behavioral health consults. Labs reviewed, are generally unremarkable, and he is medically  cleared. ____________________________________________   FINAL CLINICAL IMPRESSION(S) / ED DIAGNOSES  Final diagnoses:  Severe recurrent major depression with psychotic features      Gayla Doss, MD 05/25/15 2324

## 2015-05-25 NOTE — ED Notes (Signed)
ED BHU PLACEMENT JUSTIFICATION  Is the patient under IVC or is there intent for IVC: No.  Is the patient medically cleared: Yes.  Is there vacancy in the ED BHU: Yes.  Is the population mix appropriate for patient: Yes.  Is the patient awaiting placement in inpatient or outpatient setting: Yes.  Has the patient had a psychiatric consult: Yes.  Survey of unit performed for contraband, proper placement and condition of furniture, tampering with fixtures in bathroom, shower, and each patient room: Yes. ; Findings: nothing found.  APPEARANCE/BEHAVIOR  calm, cooperative and adequate rapport can be established  NEURO ASSESSMENT  Orientation: time, place and person  Hallucinations: No.None noted (Hallucinations)  Speech: Normal  Gait: normal  RESPIRATORY ASSESSMENT  Normal expansion. Clear to auscultation. No rales, rhonchi, or wheezing.  CARDIOVASCULAR ASSESSMENT  regular rate and rhythm, S1, S2 normal, no murmur, click, rub or gallop  GASTROINTESTINAL ASSESSMENT  soft, nontender, BS WNL, no r/g  EXTREMITIES  normal strength, tone, and muscle mass, no deformities, no erythema, induration, or nodules, no evidence of joint effusion, ROM of all joints is normal  PLAN OF CARE  Provide calm/safe environment. Vital signs assessed twice daily. ED BHU Assessment once each 12-hour shift. Collaborate with intake RN daily or as condition indicates. Assure the ED provider has rounded once each shift. Provide and encourage hygiene. Provide redirection as needed. Assess for escalating behavior; address immediately and inform ED provider.  Assess family dynamic and appropriateness for visitation as needed: No.; If necessary, describe findings: Family is not present therefore the family dynamics could not be assessed.  Educate the patient/family about BHU procedures/visitation: Yes. ; If necessary, describe findings: Pt understands the rules of the unit.   

## 2015-05-25 NOTE — BH Assessment (Signed)
Assessment Note  Jesse Pearson is an 44 y.o. male presenting to the ED voluntarily for endorsing suicidal ideations with a plan to either "take pills or use a gun"/  Patient reports recently getting out of the hospital a "couple of week ago" for having suicidal thoughts.  Pt reports that his wife is leaving him and that he has been feeling depressed about the separation.  Pt also reports have difficulty with his short/long term disability which has triggered this most recent episodes.  Pt reports hearing voices telling him to harm himself but states that he is able to tune out the voices.  Patient denies any homicidal ideations.  Pt also denies drug or alcohol use.  Axis I: Major Depression, Recurrent severe Axis II: Deferred Axis III:  Past Medical History  Diagnosis Date  . Sleep apnea   . Anxiety    Axis IV: economic problems and problems with primary support group Axis V: 51-60 moderate symptoms  Past Medical History:  Past Medical History  Diagnosis Date  . Sleep apnea   . Anxiety     Past Surgical History  Procedure Laterality Date  . Appendectomy      Family History: No family history on file.  Social History:  reports that he has never smoked. He does not have any smokeless tobacco history on file. He reports that he does not drink alcohol or use illicit drugs.  Additional Social History:  Alcohol / Drug Use History of alcohol / drug use?: No history of alcohol / drug abuse  CIWA: CIWA-Ar BP: 129/73 mmHg Pulse Rate: 74 COWS:    Allergies:  Allergies  Allergen Reactions  . Cephalosporins Other (See Comments)    Reaction:  Unknown   . Clindamycin/Lincomycin Other (See Comments)    Reaction:  Unknown     Home Medications:  (Not in a hospital admission)  OB/GYN Status:  No LMP for male patient.  General Assessment Data Location of Assessment: [facility] ED TTS Assessment: In system Is this a Tele or Face-to-Face Assessment?: Face-to-Face Is this an Initial  Assessment or a Re-assessment for this encounter?: Initial Assessment Marital status: Separated Maiden name: None Is patient pregnant?: No Pregnancy Status: No Living Arrangements: Alone Can pt return to current living arrangement?: Yes Admission Status: Voluntary Is patient capable of signing voluntary admission?: Yes Referral Source: Self/Family/Friend Insurance type: Astronomer Exam [facility] Walk-in ONLY) Medical Exam completed: Yes  Crisis Care Plan Living Arrangements: Alone Name of Psychiatrist: [facility] Name of Therapist: [facility]  Education Status Is patient currently in school?: No Current Grade: N/A Highest grade of school patient has completed: 12th Name of school: n/a Solicitor person: daughter  Risk to self with the past 6 months Suicidal Ideation: Yes-Currently Present Has patient been a risk to self within the past 6 months prior to admission? : Yes Suicidal Intent: Yes-Currently Present Has patient had any suicidal intent within the past 6 months prior to admission? : Yes Is patient at risk for suicide?: Yes Suicidal Plan?: Yes-Currently Present Has patient had any suicidal plan within the past 6 months prior to admission? : Yes Specify Current Suicidal Plan: to either take pills or shot self Access to Means: Yes Specify Access to Suicidal Means: has access to means What has been your use of drugs/alcohol within the last 12 months?: None reported Previous Attempts/Gestures: Yes How many times?: 1 Other Self Harm Risks: 1 Triggers for Past Attempts: Spouse contact (separation from spouse) Intentional  Self Injurious Behavior: None Family Suicide History: No Recent stressful life event(s): Divorce, Financial Problems Persecutory voices/beliefs?: No Depression: Yes Depression Symptoms: Despondent, Insomnia, Tearfulness, Isolating, Guilt, Loss of interest in usual pleasures, Feeling worthless/self pity Substance abuse history  and/or treatment for substance abuse?: Yes Suicide prevention information given to non-admitted patients: Not applicable  Risk to Others within the past 6 months Homicidal Ideation: No Does patient have any lifetime risk of violence toward others beyond the six months prior to admission? : No Thoughts of Harm to Others: No Current Homicidal Intent: No Current Homicidal Plan: No Access to Homicidal Means: No Identified Victim: None History of harm to others?: No Assessment of Violence: On admission Violent Behavior Description: None Does patient have access to weapons?: No Criminal Charges Pending?: No Does patient have a court date: No Is patient on probation?: No  Psychosis Hallucinations: Auditory (Pt reports hearing voices relling him to harm self) Delusions: None noted  Mental Status Report Appearance/Hygiene: Body odor, In scrubs Eye Contact: Fair Motor Activity: Freedom of movement Speech: Soft, Logical/coherent Level of Consciousness: Alert Mood: Depressed, Helpless, Despair, Worthless, low self-esteem Affect: Depressed Anxiety Level: Minimal Thought Processes: Coherent Judgement: Impaired Orientation: Person, Place, Time, Situation Obsessive Compulsive Thoughts/Behaviors: Minimal  Cognitive Functioning Concentration: Fair Memory: Recent Intact IQ: Average Insight: Fair Impulse Control: Fair Appetite: Fair Weight Loss: 0 Weight Gain: 0 Sleep: No Change Total Hours of Sleep: 4 Vegetative Symptoms: Decreased grooming  ADLScreening [facility] Assessment Services) Patient's cognitive ability adequate to safely complete daily activities?: Yes Patient able to express need for assistance with ADLs?: Yes Independently performs ADLs?: Yes (appropriate for developmental age)  Prior Inpatient Therapy Prior Inpatient Therapy: Yes Prior Therapy Facilty/Provider(s): [facility] Reason for Treatment: suicidal ideations;depression  Prior Outpatient Therapy Prior Outpatient  Therapy: No Does patient have an ACCT team?: No Does patient have Intensive In-House Services?  : No Does patient have [facility] services? : No Does patient have P4CC services?: Unknown  ADL Screening (condition at time of admission) Patient's cognitive ability adequate to safely complete daily activities?: Yes Patient able to express need for assistance with ADLs?: Yes Independently performs ADLs?: Yes (appropriate for developmental age)       Abuse/Neglect Assessment (Assessment to be complete while patient is alone) Physical Abuse: Denies Verbal Abuse: Denies Sexual Abuse: Denies Exploitation of patient/patient's resources: Denies Self-Neglect: Denies Values / Beliefs Cultural Requests During Hospitalization: None Spiritual Requests During Hospitalization: None Consults Spiritual Care Consult Needed: No Social Work Consult Needed: No Merchant navy officer (For Healthcare) Does patient have an advance directive?: No Would patient like information on creating an advanced directive?: Yes English as a second language teacher given    Additional Information 1:1 In Past 12 Months?: No CIRT Risk: No Elopement Risk: No Does patient have medical clearance?: Yes     Disposition:  Disposition Initial Assessment Completed for this Encounter: Yes Disposition of Patient: Inpatient treatment program Type of inpatient treatment program: Adult  On Site Evaluation by:   Reviewed with Physician:    [place] 05/25/2015 9:37 PM

## 2015-05-25 NOTE — ED Notes (Signed)

## 2015-05-26 ENCOUNTER — Inpatient Hospital Stay
Admit: 2015-05-26 | Discharge: 2015-05-30 | DRG: 885 | Disposition: A | Discharge status: Home / Self Care | Payer: PRIVATE HEALTH INSURANCE | Attending: Psychiatry | Admitting: Psychiatry

## 2015-05-26 DIAGNOSIS — Z630 Problems in relationship with spouse or partner: Secondary | ICD-10-CM | POA: Diagnosis not present

## 2015-05-26 DIAGNOSIS — Z820 Family history of epilepsy and other diseases of the nervous system: Secondary | ICD-10-CM | POA: Diagnosis not present

## 2015-05-26 DIAGNOSIS — F333 Major depressive disorder, recurrent, severe with psychotic symptoms: Principal | ICD-10-CM | POA: Diagnosis present

## 2015-05-26 DIAGNOSIS — G4700 Insomnia, unspecified: Secondary | ICD-10-CM | POA: Diagnosis present

## 2015-05-26 DIAGNOSIS — R45851 Suicidal ideations: Secondary | ICD-10-CM | POA: Diagnosis present

## 2015-05-26 DIAGNOSIS — Z888 Allergy status to other drugs, medicaments and biological substances status: Secondary | ICD-10-CM

## 2015-05-26 DIAGNOSIS — G4730 Sleep apnea, unspecified: Secondary | ICD-10-CM | POA: Diagnosis present

## 2015-05-26 DIAGNOSIS — Z79899 Other long term (current) drug therapy: Secondary | ICD-10-CM | POA: Diagnosis not present

## 2015-05-26 DIAGNOSIS — Z801 Family history of malignant neoplasm of trachea, bronchus and lung: Secondary | ICD-10-CM

## 2015-05-26 DIAGNOSIS — Z803 Family history of malignant neoplasm of breast: Secondary | ICD-10-CM | POA: Diagnosis not present

## 2015-05-26 DIAGNOSIS — Z9049 Acquired absence of other specified parts of digestive tract: Secondary | ICD-10-CM | POA: Diagnosis present

## 2015-05-26 LAB — LIPID PANEL
Cholesterol: 153 mg/dL (ref 0–200)
HDL: 26 mg/dL — ABNORMAL LOW (ref >40)
LDL Cholesterol: 67 mg/dL (ref 0–99)
Total CHOL/HDL Ratio: 5.9 RATIO
Triglycerides: 300 mg/dL — ABNORMAL HIGH (ref <150)
VLDL: 60 mg/dL — ABNORMAL HIGH (ref 0–40)

## 2015-05-26 LAB — HEMOGLOBIN A1C: HEMOGLOBIN A1C: 7.5 % — AB (ref 4.0–6.0)

## 2015-05-26 LAB — TSH: TSH: 2.929 u[IU]/mL (ref 0.350–4.500)

## 2015-05-26 MED ORDER — ACETAMINOPHEN 325 MG PO TABS: 650.0000 mg | ORAL_TABLET | Freq: Four times a day (QID) | ORAL | Status: DC | PRN

## 2015-05-26 MED ORDER — MAGNESIUM HYDROXIDE 400 MG/5ML PO SUSP: 30.0000 mL | Freq: Every day | ORAL | Status: DC | PRN

## 2015-05-26 MED ORDER — ALUM & MAG HYDROXIDE-SIMETH 200-200-20 MG/5ML PO SUSP: 30.0000 mL | ORAL | Status: DC | PRN

## 2015-05-26 MED ORDER — TRAZODONE HCL 100 MG PO TABS
100.0000 mg | ORAL_TABLET | Freq: Every evening | ORAL | Status: DC | PRN
  Filled 2015-05-26: qty 1

## 2015-05-26 MED ORDER — HYDROCORTISONE 1 % EX CREA
TOPICAL_CREAM | Freq: Two times a day (BID) | CUTANEOUS | Status: DC
  Administered 2015-05-26 – 2015-05-27 (×2): via TOPICAL
  Filled 2015-05-26 (×2): qty 28

## 2015-05-26 MED ORDER — FLUOXETINE HCL 20 MG PO CAPS
40.0000 mg | ORAL_CAPSULE | Freq: Every day | ORAL | Status: DC
  Administered 2015-05-26 – 2015-05-30 (×5): 40 mg via ORAL
  Filled 2015-05-26 (×5): qty 2

## 2015-05-26 MED ORDER — ARIPIPRAZOLE 5 MG PO TABS
5.0000 mg | ORAL_TABLET | Freq: Every day | ORAL | Status: DC
  Administered 2015-05-26 – 2015-05-30 (×5): 5 mg via ORAL
  Filled 2015-05-26 (×5): qty 1

## 2015-05-26 NOTE — Tx Team (Signed)
Initial Interdisciplinary Treatment Plan   PATIENT STRESSORS: Health problems Marital or family conflict   PATIENT STRENGTHS: Ability for insight Capable of independent living Communication skills General fund of knowledge Motivation for treatment/growth   PROBLEM LIST: Problem List/Patient Goals Date to be addressed Date deferred Reason deferred Estimated date of resolution  Suicidal ideation  05/26/15           Depression 05/26/15                                          DISCHARGE CRITERIA:  Improved stabilization in mood, thinking, and/or behavior Motivation to continue treatment in a less acute level of care  PRELIMINARY DISCHARGE PLAN: Return to previous living arrangement Return to previous work or school arrangements  PATIENT/FAMIILY INVOLVEMENT: This treatment plan has been presented to and reviewed with the patient, Jayce, K[place] patient and family have been given the opportunity to ask questions and make suggestions.  Meliton Samad Abisola Earlena Werst 05/26/2015, 3:11 AM

## 2015-05-26 NOTE — Progress Notes (Signed)
Patient with depressed affect and cooperative behavior with meals, meds and plan of care. NO SI/HI at this time. New name bracelet applied at patient request. Quiet with peers. Verbalizes needs appropriately with staff. Good appetite. Fair adls. Safety maintained.

## 2015-05-26 NOTE — ED Notes (Signed)
BEHAVIORAL HEALTH ROUNDING  Patient sleeping: Yes.  Patient alert and oriented: Pt is sleeping  Behavior appropriate: Yes. ; If no, describe: Pt is sleeping  Nutrition and fluids offered: Pt is sleeping  Toileting and hygiene offered: Pt is sleeping  Sitter present: yes  Law enforcement present: Yes   

## 2015-05-26 NOTE — Plan of Care (Signed)
Problem: Alteration in mood Goal: LTG-Patient reports reduction in suicidal thoughts (Patient reports reduction in suicidal thoughts and is able to verbalize a safety plan for whenever patient is feeling suicidal)  Outcome: Progressing Patient denies SI/HI, 15 minutes checks maintained.      

## 2015-05-26 NOTE — BHH Suicide Risk Assessment (Signed)
[facility] Admission Suicide Risk Assessment   Nursing information obtained from:  Patient Demographic factors:  Male, Adolescent or young adult, Caucasian, Living alone, Access to firearms Current Mental Status:  NA Loss Factors:  Loss of significant relationship Historical Factors:  NA Risk Reduction Factors:  Sense of responsibility to family Total Time spent with patient: 1 hour Principal Problem: Depression, major, recurrent, severe with psychosis Diagnosis:   Patient Active Problem List   Diagnosis Date Noted  . 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 Suicidal ideation [R45.851] 04/11/2015  . Sleep apnea [G47.30] 04/11/2015     Continued Clinical Symptoms:  Alcohol Use Disorder Identification Test Final Score (AUDIT): 0 The "Alcohol Use Disorders Identification Test", Guidelines for Use in Primary Care, Second Edition.  World Science writer [facility]). Score between 0-7:  no or low risk or alcohol related problems. Score between 8-15:  moderate risk of alcohol related problems. Score between 16-19:  high risk of alcohol related problems. Score 20 or above:  warrants further diagnostic evaluation for alcohol dependence and treatment.   CLINICAL FACTORS:   Depression:   Severe Currently Psychotic   Musculoskeletal: Strength & Muscle Tone: within normal limits Gait & Station: normal Patient leans: N/A  Psychiatric Specialty Exam: I reviewed PE performed in the ER and concur with its findings. Physical Exam  Nursing note and vitals reviewed.   Review of Systems  All other systems reviewed and are negative.   Blood pressure 125/86, pulse 82, temperature 98.5 F (36.9 C), temperature source Oral, resp. rate 18, height 6' (1.829 m), weight 131.543 kg (290 lb), SpO2 95 %.Body mass index is 39.32 kg/(m^2).  General Appearance: Casual  Eye Contact::  Good   Speech:  Slow  Volume:  Decreased  Mood:  Depressed and Hopeless  Affect:  Flat  Thought Process:  Goal Directed  Orientation:  Full (Time, Place, and Person)  Thought Content:  Hallucinations: Auditory  Suicidal Thoughts:  Yes.  with intent/plan  Homicidal Thoughts:  No  Memory:  Immediate;   Fair Recent;   Fair Remote;   Fair  Judgement:  Fair  Insight:  Fair  Psychomotor Activity:  Normal  Concentration:  Fair  Recall:  [employer] of Knowledge:Fair  Language: Fair  Akathisia:  No  Handed:  Right  AIMS (if indicated):     Assets:  Communication Skills Desire for Improvement Financial Resources/Insurance Housing Social Support  Sleep:  Number of Hours: 2.25  Cognition: WNL  ADL's:  Intact     COGNITIVE FEATURES THAT CONTRIBUTE TO RISK:  None    SUICIDE RISK:   Moderate:  Frequent suicidal ideation with limited intensity, and duration, some specificity in terms of plans, no associated intent, good self-control, limited dysphoria/symptomatology, some risk factors present, and identifiable protective factors, including available and accessible social support.  PLAN OF CARE: Hospital admission, medication management, discharge planning.  Medical Decision Making:  New problem, with additional work up planned, Review of Psycho-Social Stressors (1), Review or order clinical lab tests (1), Review of Medication Regimen & Side Effects (2) and Review of New Medication or Change in Dosage (2)   Jesse Pearson is a 44 year old male with a history of psychotic depression admitted for suicidal ideation with a plan in the context of marital problems.  1. Suicidal ideation. He still feels suicidal. He is able to contract for safety in the hospital. He is on 15 minutes checks.  2. Mood. He was continued onProzac for depression and Abiliy or psychosis. Marland Kitchen 3. Disposition. He will be discharged to home. He will follow up with [facility].    I certify that inpatient services furnished can  reasonably be expected to improve the patient's condition.   Jesse Pearson 05/26/2015, 6:47 PM

## 2015-05-26 NOTE — Progress Notes (Signed)
Recreation Therapy Notes  Date: 07.28.16 Time: 3:00 pm Location: Craft Room  Group Topic: Coping Skills, Leisure Education  Goal Area(s) Addresses:  Patient will identify things they are grateful for. Patient will identify how being grateful can influence decision making.  Behavioral Response: Attentive  Intervention: Lexicographer  Activity: Patients were given an "I Am Grateful For" worksheet and instructed to list at least one thing they are grateful for under each category.   Education:LRT educated patients on why it is important to be grateful.  Education Outcome: In group clarification offered   Clinical Observations/Feedback: Patient completed activity. Patient did not contribute to group discussion.  Jacquelynn Cree, LRT/CTRS 05/26/2015 4:28 PM

## 2015-05-26 NOTE — Progress Notes (Signed)
44 year old, caucasian male, alert and oriented x 4, admitted under services of Dr Jennet Maduro; for auditory hallucination and suicidal ideation.Patient endorsed that his stressors;marital issues; his wife leaving him, out of work,  and lack of support are contributing to his suicidal ideation.Upon arrival to the unit patient was searched for contraband non found, skin assessment was done with another registered nurse, rashes and skin lesion was noted all over his upper torso, pt endorsed he suffers from psoriasis. Affect is flat and sad but brightens upon approach, thoughts are organized, denies SI/HI/AVH, oriented to the unit,15 minutes checks maintained, will continue to monitor.

## 2015-05-26 NOTE — Progress Notes (Signed)
INITIAL NUTRITION ASSESSMENT   INTERVENTION:  Meals and Snacks: encourage menu completion to best meet pt preferences Medical Food Supplement Therapy: will recommend on follow if intake inadequate   NUTRITION DIAGNOSIS:  Inadequate oral intake related to poor appetite as evidenced by weight loss.   GOAL:  Patient will meet greater than or equal to 90% of their needs  MONITOR:  Energy Intake, Anthropometrics   REASON FOR ASSESSMENT:  Malnutrition Screening Tool  ASSESSMENT:  Jesse Pearson is a 44 y.o. male with auditory hallucination and suicidal ideation.   Past Medical History  Diagnosis Date  . Sleep apnea   . Anxiety     Diet Order: Regular  Current Nutrition: Pt eating 100% of meals since admission. RD notes pt eating 95-100% of meals on last admission as well. Per MST pt with decreased appetite PTA.  Medications: reviewed  Protein Profile:  Recent Labs Lab 05/25/15 1534  ALBUMIN 4.4   Glucose and Electrolyte/Renal profile:   Recent Labs Lab 05/25/15 1534  NA 139  K 3.4*  CL 104  CO2 27  BUN 13  CREATININE 1.01  CALCIUM 9.3  GLUCOSE 144*    Digestive System: WDL per Nsg  Anthropometrics:   Body mass index is 39.32 kg/(m^2).  Filed Weights   05/26/15 0206  Weight: 290 lb (131.543 kg)   Wt Readings from Last 10 Encounters:  05/26/15  290 lb (131.543 kg)  05/25/15  285 lb (129.275 kg)  04/11/15  315 lb (142.883 kg)  04/11/15  310 lb (140.615 kg)   Weight Trend: 8% weight loss in 5 weeks per [facility] encounters.   LOW Care Level  Leda Quail, [place], [place] Pager [phone]

## 2015-05-26 NOTE — Progress Notes (Signed)
Recreation Therapy Notes  INPATIENT RECREATION THERAPY ASSESSMENT  Patient Details Name: Jesse Pearson MRN: [id number] DOB: 1971/05/03 Today's Date: 05/26/2015  Patient Stressors: Relationship, Other (Comment), Work Scientist, [employer] trying to cancel his short term disability)  Coping Skills:   Isolate, Avoidance, Music, Sports, Other (Comment) (Positive affirmation statements)  Personal Challenges: Communication, Concentration, Decision-Making, Expressing Yourself, Problem-Solving, Relationships, Self-Esteem/Confidence, Social Interaction, Stress Management, Time Management, Trusting Others  Leisure Interests (2+):  Individual - TV, Nature - Therapist, art Resources:  Yes  Community Resources:  [place], [employer]  Current Use: Yes  If no, Barriers?:    Patient Strengths:  Good father, good brother  Patient Identified Areas of Improvement:  Self-esteem  Current Recreation Participation:  Watching TV  Patient Goal for Hospitalization:  To get himself better  [place] of Residence:  [place] of Residence:  [place]   Current SI (including self-harm):  No ("I have today, but not at the moment.")  Current HI:  No  Consent to Intern Participation: N/A   Jacquelynn Cree, LRT/CTRS 05/26/2015, 1:04 PM

## 2015-05-26 NOTE — BHH Counselor (Signed)
Call made to [facility] to verify insurance.  VM states to call back between 8 am - 5pm.

## 2015-05-26 NOTE — Plan of Care (Signed)
Problem: Ineffective individual coping Goal: LTG: Patient will report a decrease in negative feelings Outcome: Progressing No negative feelings reported at this time.

## 2015-05-26 NOTE — Progress Notes (Addendum)
   05/26/15 0900  Clinical Encounter Type  Visited With Patient  Visit Type Spiritual support  Spiritual Encounters  Spiritual Needs Grief support  Stress Factors  Patient Stress Factors Family relationships   Faith: Methodist Status: Depression, major, recurrent, severe with psychosis, aware, melancholy but very engaging Family: None present, has 1 son Visit Assessment: The chaplain introduced pastoral care and the patient shared that he was grieving over family relationships namely his wife's (21 years or so) decision to divorce him. He said that she told him in April of this year. He also shared that she will not communicate a reason but just angrily blames him for her desire for liberation. He shared that he enjoyed fishing with her but can't fish without her. He said that his 6 yr old son lives with him. He shared that this is his 2nd stay in the hospital and that he thinks that it's wrong for him to have suicidal thoughts. The chaplain allowed the patient to have a platform to verbalize his thoughts. 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 If the patient would like to request another Chaplain visit, he knows that he has this option by making a request with the staff.  Pastoral Care can be reached via pager [phone] and by submitting an online request

## 2015-05-26 NOTE — H&P (Signed)
Psychiatric Admission Assessment Adult  Patient Identification: Jesse Pearson MRN:  [id number] Date of Evaluation:  05/26/2015 Chief Complaint:  Severe major depression Principal Diagnosis: Depression, major, recurrent, severe with psychosis Diagnosis:   Patient Active Problem List   Diagnosis Date Noted  . 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 Suicidal ideation [R45.851] 04/11/2015  . Sleep apnea [G47.30] 04/11/2015   History of Present Illness:  Identyfying data. Jesse Pearson is a 44 year old male with a history of depression.  Chief complaint. "I did not feel safe."  History of present illness. The patient was hospitalized at [facility] one month ago for depression and suicidal ideation after his wife left him. He followed up with Dr. Thornell Mule at [facility]. His prozac was increased to 40 mg for depression and he was conrinued on Abilify or psychotic features. He has been compliant with treatment and has not been using substances. He was doing well. However, ywo days ago he wasinormed that he no longer will enjoy short term disability. He still is on FMLA. He believes that it is in error. His employer/insurer requested a doctor's note and Dr. Mervyn Skeeters. Was not able to provide one. The patient became depressed, distraught, suicidal and psychotic and came to the hosptal.  Past psychiatric history. One prior hospitalization. No suicide attempts.  Family psychiatric history. None.  Social history. Wife left several months ago. He lives with his 62 year old son. 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  Total Time spent with patient: 1 hour  Past Medical History:  Past Medical History  Diagnosis Date  . Sleep apnea   . Anxiety     Past Surgical History  Procedure Laterality Date  . Appendectomy      Family History:  Family History  Problem Relation Age of Onset  . Breast cancer Mother   . Dementia Mother   . Other Father   . Lung cancer Father   . Other Sister    Social History:  History  Alcohol Use No     History  Drug Use No    History   Social History  . Marital Status: Married    Spouse Name: N/A  . Number of Children: N/A  . Years of Education: N/A   Social History Main Topics  . Smoking status: Never Smoker   . Smokeless tobacco: Not on file  . Alcohol Use: No  . Drug Use: No  . Sexual Activity: No   Other Topics Concern  . None   Social History Narrative   Additional Social History:                          Musculoskeletal: Strength & Muscle Tone: within normal limits Gait & Station: normal Patient leans: N/A  Psychiatric Specialty Exam: Physical Exam  Nursing note and vitals reviewed.   Review of Systems  All other systems reviewed and are negative.   Blood pressure 125/86, pulse 82, temperature 98.5 F (36.9 C), temperature source Oral, resp. rate 18, height 6' (1.829 m), weight 131.543 kg (290 lb), SpO2 95 %.Body mass index is 39.32 kg/(m^2).  See SRA.  Sleep:  Number of Hours: 2.25   Risk to Self: Is patient at risk for suicide?: Yes Risk to Others:   Prior Inpatient Therapy:   Prior Outpatient Therapy:    Alcohol Screening: 1. How often do you have a drink containing alcohol?: Never 2. How many drinks containing alcohol do you have on a typical day when you are drinking?: 1 or 2 3. How often do you have six or more drinks on one occasion?: Never Preliminary Score: 0 4. How often during the last year have you found that you were not able to stop drinking once you had started?: Never 5. How often during the last year have you failed to do what was normally expected from you becasue of drinking?: Never 6. How often during the last year have you  needed a first drink in the morning to get yourself going after a heavy drinking session?: Never 7. How often during the last year have you had a feeling of guilt of remorse after drinking?: Never 8. How often during the last year have you been unable to remember what happened the night before because you had been drinking?: Never 9. Have you or someone else been injured as a result of your drinking?: No 10. Has a relative or friend or a doctor or another health worker been concerned about your drinking or suggested you cut down?: No Alcohol Use Disorder Identification Test Final Score (AUDIT): 0 Brief Intervention: AUDIT score less than 7 or less-screening does not suggest unhealthy drinking-brief intervention not indicated  Allergies:   Allergies  Allergen Reactions  . Cephalosporins Other (See Comments)    Reaction:  Rash and itching   . Clindamycin/Lincomycin Other (See Comments)    Reaction:  Rash and itching.    Lab Results:  Results for orders placed or performed during the hospital encounter of 05/26/15 (from the past 48 hour(s))  Hemoglobin A1c     Status: Abnormal   Collection Time: 05/25/15  3:34 PM  Result Value Ref Range   Hgb A1c MFr Bld 7.5 (H) 4.0 - 6.0 %  Lipid panel, fasting     Status: Abnormal   Collection Time: 05/25/15  3:34 PM  Result Value Ref Range   Cholesterol 153 0 - 200 mg/dL   Triglycerides 161 (H) <150 mg/dL   HDL 26 (L) >09 mg/dL   Total CHOL/HDL Ratio 5.9 RATIO   VLDL 60 (H) 0 - 40 mg/dL   LDL Cholesterol 67 0 - 99 mg/dL    Comment:        Total Cholesterol/HDL:CHD Risk Coronary Heart Disease Risk Table                     Men   Women  1/2 Average Risk   3.4   3.3  Average Risk       5.0   4.4  2 X Average Risk   9.6   7.1  3 X Average Risk  23.4   11.0        Use the calculated Patient Ratio above and the CHD Risk Table to determine the patient's CHD Risk.        ATP III CLASSIFICATION (LDL):  <100     mg/dL   Optimal  604-540  mg/dL    Near or Above                    Optimal  130-159  mg/dL   Borderline  981-191  mg/dL   High  >161     mg/dL   Very High   TSH     Status: None   Collection Time: 05/25/15  3:34 PM  Result Value Ref Range   TSH 2.929 0.350 - 4.500 uIU/mL   Current Medications: Current Facility-Administered Medications  Medication Dose Route Frequency Provider Last Rate Last Dose  . acetaminophen (TYLENOL) tablet 650 mg  650 mg Oral Q6H PRN Audery Amel, MD      . alum & mag hydroxide-simeth (MAALOX/MYLANTA) 200-200-20 MG/5ML suspension 30 mL  30 mL Oral Q4H PRN Audery Amel, MD      . ARIPiprazole (ABILIFY) tablet 5 mg  5 mg Oral Daily Audery Amel, MD   5 mg at 05/26/15 0956  . FLUoxetine (PROZAC) capsule 40 mg  40 mg Oral Daily Audery Amel, MD   40 mg at 05/26/15 0956  . hydrocortisone cream 1 %   Topical BID Audery Amel, MD      . magnesium hydroxide (MILK OF MAGNESIA) suspension 30 mL  30 mL Oral Daily PRN Audery Amel, MD       PTA Medications: Prescriptions prior to admission  Medication Sig Dispense Refill Last Dose  . ARIPiprazole (ABILIFY) 5 MG tablet Take 1 tablet (5 mg total) by mouth daily. 30 tablet 0 05/25/2015 at Unknown time  . FLUoxetine (PROZAC) 20 MG capsule Take 1 capsule (20 mg total) by mouth daily. 30 capsule 0 Past Week at Unknown time  . fluticasone (FLONASE) 50 MCG/ACT nasal spray Place 1 spray into both nostrils 2 (two) times daily. 16 g 0   . hydrocortisone cream 1 % Apply topically 2 (two) times daily. 1.5 g 0   . pantoprazole (PROTONIX) 40 MG tablet Take 1 tablet (40 mg total) by mouth daily. (Patient not taking: Reported on 05/26/2015) 30 tablet 0 Not Taking at Unknown time    Previous Psychotropic Medications: Yes   Substance Abuse History in the last 12 months:  No.    Consequences of Substance Abuse: NA  Results for orders placed or performed during the hospital encounter of 05/26/15 (from the past 72 hour(s))  Hemoglobin A1c     Status: Abnormal    Collection Time: 05/25/15  3:34 PM  Result Value Ref Range   Hgb A1c MFr Bld 7.5 (H) 4.0 - 6.0 %  Lipid panel, fasting     Status: Abnormal   Collection Time: 05/25/15  3:34 PM  Result Value Ref Range   Cholesterol 153 0 - 200 mg/dL   Triglycerides 096 (H) <150 mg/dL   HDL 26 (L) >04 mg/dL   Total CHOL/HDL Ratio 5.9 RATIO   VLDL 60 (H) 0 - 40 mg/dL   LDL Cholesterol 67 0 - 99 mg/dL    Comment:        Total Cholesterol/HDL:CHD Risk Coronary Heart Disease Risk Table                     Men   Women  1/2 Average Risk   3.4   3.3  Average Risk       5.0   4.4  2 X Average Risk   9.6   7.1  3 X Average Risk  23.4   11.0        Use the calculated Patient Ratio above and the CHD Risk Table to determine the patient's CHD Risk.        ATP III CLASSIFICATION (LDL):  <  100     mg/dL   Optimal  409-811  mg/dL   Near or Above                    Optimal  130-159  mg/dL   Borderline  914-782  mg/dL   High  >956     mg/dL   Very High   TSH     Status: None   Collection Time: 05/25/15  3:34 PM  Result Value Ref Range   TSH 2.929 0.350 - 4.500 uIU/mL    Observation Level/Precautions:  15 minute checks  Laboratory:  CBC Chemistry Profile UDS UA  Psychotherapy:    Medications:    Consultations:    Discharge Concerns:    Estimated LOS:  Other:     Psychological Evaluations: No   Treatment Plan Summary: Daily contact with patient to assess and evaluate symptoms and progress in treatment and Medication management  Medical Decision Making:  New problem, with additional work up planned, Review of Psycho-Social Stressors (1), Review or order clinical lab tests (1), Review of Medication Regimen & Side Effects (2) and Review of New Medication or Change in Dosage (2)   Mr. Herd is a 44 year old male with a history of psychotic depression admitted for suicidal ideation with a plan in the context of marital problems.  1. Suicidal ideation. He still feels suicidal. He is able to  contract for safety in the hospital. He is on 15 minutes checks.  2. Mood. He was continued onProzac for depression and Abiliy or psychosis. Marland Kitchen 3.Insomnia. Will ofer Restoril.  4.  Disposition. He will be discharged to home. He will follow up with [facility].   I certify that inpatient services furnished can reasonably be expected to improve the patient's condition.   Dula Havlik 7/28/20167:02 PM

## 2015-05-27 NOTE — BHH Group Notes (Signed)
[facility] LCSW Aftercare Discharge Planning Group Note  05/27/2015 1:43 PM  Participation Quality:  Appropriate and Attentive  Affect:  Depressed  Cognitive:  Alert and Appropriate  Insight:  Improving  Engagement in Group:  Improving  Modes of Intervention:  Socialization and Support  Summary of Progress/Problems: Patient attended and participated appropriately in group and shared that his SMART goal is to "get rid of bad thoughts".   Lulu Riding, MSW, LCSWA 05/27/2015, 1:43 PM

## 2015-05-27 NOTE — Progress Notes (Signed)
Recreation Therapy Notes  Date: 07.29.16 Time: 3:20 pm Location: Craft Room  Group Topic: Coping Skills  Goal Area(s) Addresses:  Patients will verbalize emotion experienced while coloring. Patients will verbalize benefit of coloring.  Behavioral Response: Attentive, Interactive  Intervention: [place]  Activity: Patients were given a mandala and instructed to color.  Education: LRT educated patients on benefits of using art as a Associate Professor  Education Outcome: Acknowledges education/In group clarification offered  Clinical Observations/Feedback: Patient colored her worksheet. Patient contributed to group discussion by stating what emotions he experienced while coloring.  Jacquelynn Cree, LRT/CTRS 05/27/2015 4:16 PM

## 2015-05-27 NOTE — Progress Notes (Signed)
D:  Pt was pleasant and appropriate during interaction, denies HI/AVH, endorses some Passive SI thoughts on and off throughout the day today, verbal contract for safety, depressed/flat.    A:  Emotional support provided, Encouraged pt to continue with treatment plan and attend all group activities, q15 min checks maintained for safety.  R:  Pt is receptive, going to groups, pleasant and cooperative with staff and other patients on the unit.

## 2015-05-27 NOTE — Progress Notes (Signed)
D-Patient has flat affect and sad mood. He appears lethargic but has been out of room and attending groups today. Reports intermittent SI. Contracts for safety. Denies AH today.  A-On q 15 minute checks. Given meds. Offered emotional support. R-Sad and depressed but cooperative.

## 2015-05-27 NOTE — Plan of Care (Signed)
Problem: Ineffective individual coping Goal: STG: Patient will remain free from self harm Outcome: Progressing No evidence of self-harm. Still having SI but is contracting for safety.

## 2015-05-27 NOTE — BHH Counselor (Signed)
Adult Comprehensive Assessment  Patient ID: Jesse Pearson, male   DOB: 25-Oct-1971, 44 y.o.   MRN: [id number]  Information Source:    Current Stressors:     Living/Environment/Situation:  Living Arrangements: Alone, Children  Family History:     Childhood History:     Education:     Employment/Work Situation:      Surveyor, quantity Resources:      Alcohol/Substance Abuse:      Social Support System:      Leisure/Recreation:      Strengths/Needs:      Discharge Plan:   Will patient be returning to same living situation after discharge?: Yes Currently receiving community mental health services: Yes (From Whom) Evlyn Clines) Does patient have financial barriers related to discharge medications?: No  Summary/Recommendations:  Patient was recently admitted for depression with SI after his wife left him and patient is not sure why his marriage is ending but is also concerned his 13yo daughter realtioship is strained. Patient lives alone and is seeking FMLA extension and reports back to ED to be admitted feeling suicidal. Patient has been seen at [facility] and will dsicharge home next week.     Lulu Riding., MSW, Theresia Majors  05/27/2015

## 2015-05-27 NOTE — Tx Team (Signed)
Interdisciplinary Treatment Plan Update (Adult)  Date:  05/27/2015 Time Reviewed:  1:57 PM  Progress in Treatment: Attending groups: Yes. Participating in groups:  Yes. Taking medication as prescribed:  Yes. Tolerating medication:  Yes. Family/Significant othe contact made:  Yes, individual(s) contacted:  sister Eunice Blase Patient understands diagnosis:  Yes. Discussing patient identified problems/goals with staff:  Yes. Medical problems stabilized or resolved:  Yes. Denies suicidal/homicidal ideation: Yes. Issues/concerns per patient self-inventory:  No. Other:  New problem(s) identified: No, Describe:  none reported  Discharge Plan or Barriers: Patient is currently depressed after his wife left him for 20 years and his 13yo dtr relationship is not good with him.   Reason for Continuation of Hospitalization: Depression Suicidal ideation  Comments:  Estimated length of stay: up to 4 days  New goal(s):  Review of initial/current patient goals per problem list:   See Care Plan  Attendees: Physician:  Kristine Linea, MD 7/29/20161:57 PM  Nursing:   Shelia Media, RN 7/29/20161:57 PM  Other:  Beryl Meager, LCSWA 7/29/20161:57 PM  Other:   7/29/20161:57 PM  Other:   7/29/20161:57 PM  Other:  7/29/20161:57 PM  Other:  7/29/20161:57 PM  Other:  7/29/20161:57 PM  Other:  7/29/20161:57 PM  Other:  7/29/20161:57 PM  Other:  7/29/20161:57 PM  Other:   7/29/20161:57 PM   Scribe for Treatment Team:   Lulu Riding, MSW, LCSWA 05/27/2015, 1:57 PM

## 2015-05-27 NOTE — Plan of Care (Signed)
Problem: Diagnosis: Increased Risk For Suicide Attempt Goal: STG-Patient Will Attend All Groups On The Unit Outcome: Progressing Pt is receptive and attends groups on the unit, pleasant and appropriate.

## 2015-05-27 NOTE — BHH Group Notes (Signed)
BHH Group Notes:  (Nursing/MHT/Case Management/Adjunct)  Date:  05/27/2015  Time:  12:44 PM  Type of Therapy:  Group Therapy  Participation Level:  Did Not Attend  Summary of Progress/Problems:  Adlai Nieblas De'Chelle Jasilyn Holderman 05/27/2015, 12:44 PM 

## 2015-05-27 NOTE — Progress Notes (Signed)
[facility] MD Progress Note  05/27/2015 8:48 PM Jesse Pearson  MRN:  [id number]  Subjective:  Jesse Pearson has a history of recent depression after his wife left him. He was hospitalized at [facility] one month ago. He returns to the hospital suicidal and psychotic after he discovered that his short term disability did not get approved.  He still feels suicidal today. He does not want medication adjustments. Sleep and appetite are good. He claims weight loss but is overwheight. Good group participation.  Principal Problem: Depression, major, recurrent, severe with psychosis Diagnosis:   Patient Active Problem List   Diagnosis Date Noted  . 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 Suicidal ideation [R45.851] 04/11/2015  . Sleep apnea [G47.30] 04/11/2015   Total Time spent with patient: 20 minutes   Past Medical History:  Past Medical History  Diagnosis Date  . Sleep apnea   . Anxiety     Past Surgical History  Procedure Laterality Date  . Appendectomy     Family History:  Family History  Problem Relation Age of Onset  . Breast cancer Mother   . Dementia Mother   . Other Father   . Lung cancer Father   . Other Sister    Social History:  History  Alcohol Use No     History  Drug Use No    History   Social History  . Marital Status: Married    Spouse Name: N/A  . Number of Children: N/A  . Years of Education: N/A   Social History Main Topics  . Smoking status: Never Smoker   . Smokeless tobacco: Not on file  . Alcohol Use: No  . Drug Use: No  . Sexual Activity: No   Other Topics Concern  . None   Social History Narrative   Additional History:    Sleep: Good  Appetite:  Good   Assessment:   Musculoskeletal: Strength & Muscle Tone: within normal limits Gait & Station: normal Patient leans: N/A   Psychiatric Specialty Exam: Physical  Exam  Nursing note and vitals reviewed.   Review of Systems  All other systems reviewed and are negative.   Blood pressure 107/81, pulse 93, temperature 98.6 F (37 C), temperature source Oral, resp. rate 18, height 6' (1.829 m), weight 131.543 kg (290 lb), SpO2 95 %.Body mass index is 39.32 kg/(m^2).  General Appearance: Casual  Eye Contact::  Fair  Speech:  Clear and Coherent  Volume:  Normal  Mood:  Depressed  Affect:  Flat  Thought Process:  Goal Directed  Orientation:  Full (Time, Place, and Person)  Thought Content:  Hallucinations: Auditory  Suicidal Thoughts:  Yes.  without intent/plan  Homicidal Thoughts:  No  Memory:  Immediate;   Fair Recent;   Fair Remote;   Fair  Judgement:  Fair  Insight:  Fair  Psychomotor Activity:  Normal  Concentration:  Fair  Recall:  [employer] of Knowledge:Fair  Language: Fair  Akathisia:  No  Handed:  Right  AIMS (if indicated):     Assets:  Communication Skills Desire for Improvement Financial Resources/Insurance Housing  ADL's:  Intact  Cognition: WNL  Sleep:  Number of Hours: 7     Current Medications: Current Facility-Administered Medications  Medication Dose Route Frequency Provider Last Rate Last Dose  . acetaminophen (TYLENOL) tablet 650 mg  650 mg Oral Q6H PRN Audery Amel, MD      .  alum & mag hydroxide-simeth (MAALOX/MYLANTA) 200-200-20 MG/5ML suspension 30 mL  30 mL Oral Q4H PRN Audery Amel, MD      . ARIPiprazole (ABILIFY) tablet 5 mg  5 mg Oral Daily Audery Amel, MD   5 mg at 05/27/15 0927  . FLUoxetine (PROZAC) capsule 40 mg  40 mg Oral Daily Audery Amel, MD   40 mg at 05/27/15 7829  . hydrocortisone cream 1 %   Topical BID Audery Amel, MD      . magnesium hydroxide (MILK OF MAGNESIA) suspension 30 mL  30 mL Oral Daily PRN Audery Amel, MD      . traZODone (DESYREL) tablet 100 mg  100 mg Oral QHS PRN Brandy Hale, MD        Lab Results: No results found for this or any previous visit (from the  past 48 hour(s)).  Physical Findings: AIMS: Facial and Oral Movements Muscles of Facial Expression: None, normal Lips and Perioral Area: None, normal Jaw: None, normal Tongue: None, normal,Extremity Movements Upper (arms, wrists, hands, fingers): None, normal Lower (legs, knees, ankles, toes): None, normal, Trunk Movements Neck, shoulders, hips: None, normal, Overall Severity Severity of abnormal movements (highest score from questions above): None, normal Incapacitation due to abnormal movements: None, normal Patient's awareness of abnormal movements (rate only patient's report): No Awareness, Dental Status Current problems with teeth and/or dentures?: No Does patient usually wear dentures?: No  CIWA:    COWS:     Treatment Plan Summary: Daily contact with patient to assess and evaluate symptoms and progress in treatment and Medication management   Medical Decision Making:  Established Problem, Stable/Improving (1), Review of Psycho-Social Stressors (1), Review or order clinical lab tests (1), Review of Medication Regimen & Side Effects (2) and Review of New Medication or Change in Dosage (2)   Jesse Pearson is a 44 year old male with a history of psychotic depression admitted for suicidal ideation with a plan in the context of marital problems.  1. Suicidal ideation. He still feels suicidal. He is able to contract for safety in the hospital. He is on 15 minutes checks.  2. Mood. He was continued onProzac for depression and Abiliy or psychosis. . 3. Insomnia. Will ofer Restoril.  4.Social. I completed short term disability form for his employer.  5. Disposition. He will be discharged to home. He will follow up with [facility].      Kamaal Cast 05/27/2015, 8:48 PM

## 2015-05-27 NOTE — Progress Notes (Signed)
Recreation Therapy Notes  INPATIENT RECREATION TR PLAN  Patient Details Name: Jesse Pearson MRN: [id number] DOB: 1971-03-20 Today's Date: 05/27/2015  Rec Therapy Plan Is patient appropriate for Therapeutic Recreation?: Yes Treatment times per week: At least once a week TR Treatment/Interventions: 1:1 session, Group participation (Comment) (Appropriate participation in daily recreation therapy tx)  Discharge Criteria Pt will be discharged from therapy if:: Treatment goals are met Treatment plan/goals/alternatives discussed and agreed upon by:: Patient/family  Discharge Summary Short term goals set: See Care Plan Short term goals met: Complete Progress toward goals comments: One-to-one attended Which groups?: Coping skills, Leisure education One-to-one attended: Self-esteem Reason goals not met: N/A Therapeutic equipment acquired: None Reason patient discharged from therapy: Treatment goals met Pt/family agrees with progress & goals achieved: Yes Date patient discharged from therapy: 05/27/15   Leonette Monarch, LRT/CTRS 05/27/2015, 4:19 PM

## 2015-05-27 NOTE — Plan of Care (Signed)
Problem: [facility] Participation in Recreation Therapeutic Interventions Goal: STG-Patient will demonstrate improved self esteem by identif STG: Self-Esteem - Within 3 treatment sessions, patient will verbalize at least 5 positive affirmation statements in one treatment session to increase self-esteem post d/c. Outcome: Completed/Met Date Met:  05/27/15 Treatment Session 1; Completed 1 out of 1: At approximately 12:25 pm, LRT met with patient in craft room. Patient verbalized 5 positive affirmation statements. Patient reported it felt "good". LRT encouraged patient to continue saying positive affirmation statements.  Leonette Monarch, LRT/CTRS 07.29.16 1:45 pm

## 2015-05-28 NOTE — Progress Notes (Signed)
D: Passive SI-contracts for safety.  Pt denies HI/AVH. Pt is pleasant and cooperative. Pt forwards little information, but seems receptive to advice. Pt stated he feels he is doing better because of the medications and going to the groups.   A: Pt was offered support and encouragement. Pt was given scheduled medications. Pt was encourage to attend groups. Q 15 minute checks were done for safety.   R:Pt attends groups and interacts well with peers and staff. Pt is taking medication. Pt has no complaints at this time .Pt receptive to treatment and safety maintained on unit.

## 2015-05-28 NOTE — Plan of Care (Signed)
Problem: Consults Goal: [facility] General Treatment Patient Education Outcome: Progressing Patient verbalizes decrease in SI. Encouraged to discuss with staff thoughts and feelings here in supportive environment. Encouraged therapy groups to learn and initiate coping skills for management of stressors and diagnosis. Patient verbalizes understanding.

## 2015-05-28 NOTE — Progress Notes (Signed)
Patient with depressed affect and cooperative behavior with meals, meds and plan of care. States self harming/suicidal thoughts are decreasing, agrees to speak with staff rt suicidal thoughts if persistent. Denies HI. Good appetite, fair adls. Minimal interaction with peers. Agrees to attend therapy groups to learn and initiate coping skills for management of stressors and diagnosis. Safety maintained.

## 2015-05-28 NOTE — [facility] Group Notes (Signed)
[facility] LCSW Group Therapy  05/28/2015 10:47 AM  Type of Therapy:  Group Therapy  Participation Level:  Minimal  Participation Quality:  Attentive  Affect:  Flat  Cognitive:  Alert  Insight:  Limited  Engagement in Therapy:  Limited  Modes of Intervention:  Discussion, Education, Socialization and Support  Summary of Progress/Problems: Feelings around Relapse. Group members discussed the meaning of relapse and shared personal stories of relapse, how it affected them and others, and how they perceived themselves during this time. Group members were encouraged to identify triggers, warning signs and coping skills used when facing the possibility of relapse. Social supports were discussed and explored in detail. Jesse Pearson attended group and stayed the entire time. He sat quietly and listened to other group members.   Jesse Pearson L Jesstin Studstill MSW, LCSWA  05/28/2015, 10:47 AM

## 2015-05-28 NOTE — [facility] Group Notes (Signed)
[facility] LCSW Group Therapy  05/28/2015 4:39 PM  Type of Therapy:  Group Therapy  Participation Level:  Minimal  Participation Quality:  Attentive  Affect:  Depressed  Cognitive:  Alert  Insight:  Improving  Engagement in Therapy:  Improving  Modes of Intervention:  Discussion, Education, Socialization and Support  Summary of Progress/Problems: Pts were asked to identify what balance means to them. They were encouraged to identify what throws them off balance and how to regain balance.  Matt attended group and stayed the entire time. He stated when he is stressed, it is difficult for him to make healthy decisions.   [employer] MSW, LCSWA   05/28/2015, 4:39 PM

## 2015-05-28 NOTE — Plan of Care (Signed)
Problem: Ineffective individual coping Goal: LTG: Patient will report a decrease in negative feelings Outcome: Progressing Pt stated his depression is getting better.  Goal: STG: Patient will remain free from self harm Outcome: Progressing Pt safe on the unit at this time   Problem: Diagnosis: Increased Risk For Suicide Attempt Goal: LTG-Patient Will Show Positive Response to Medication LTG (by discharge) : Patient will show positive response to medication and will participate in the development of the discharge plan.  Outcome: Progressing Pt stated "i feel less depressed because of the medication", pt interacting on the unit with his peers Goal: STG-Patient Will Attend All Groups On The Unit Outcome: Progressing Pt stated he went to all the groups

## 2015-05-28 NOTE — [facility] Group Notes (Signed)
[facility] Group Notes:  (Nursing/MHT/Case Management/Adjunct)  Date:  05/28/2015  Time:  9:02 AM  Type of Therapy:  goals  Participation Level:  Active  Participation Quality:  Appropriate  Affect:  Appropriate  Cognitive:  Appropriate  Insight:  Appropriate  Engagement in Group:  Engaged  Modes of Intervention:  Discussion  Summary of Progress/Problems:  Jesse Pearson 05/28/2015, 9:02 AM

## 2015-05-28 NOTE — Progress Notes (Signed)
Patient was visible in the dayroom watching TV during most of the latter part of the evening. Did go outside during group time. Denies si/hi or avh. States "I feel better." No attempts to harm self or others. Refused Hydrocortisone cream to face. No inappropriate behaviors noted or reported. Q 15 minute checks cont for safety.

## 2015-05-28 NOTE — Progress Notes (Signed)
[facility] MD Progress Note  05/28/2015 3:10 PM Jesse Pearson  MRN:  [id number] Subjective:  Patient is known to this writer from a prior admission. He states that the same things occurred to cause him stress such as marital discord. He also states he got into some type of dispute with his insurance company. He states that he then developed his suicidal ideation and sought help to prevent acting on it. He states about a week prior to his admission his fluoxetine was increased from 20 mg to 40 mg. He states he feels that this has been somewhat helpful for him. Principal Problem: Depression, major, recurrent, severe with psychosis Diagnosis:   Patient Active Problem List   Diagnosis Date Noted  . 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 Suicidal ideation [R45.851] 04/11/2015  . Sleep apnea [G47.30] 04/11/2015   Total Time spent with patient: 15 minutes   Past Medical History:  Past Medical History  Diagnosis Date  . Sleep apnea   . Anxiety     Past Surgical History  Procedure Laterality Date  . Appendectomy     Family History:  Family History  Problem Relation Age of Onset  . Breast cancer Mother   . Dementia Mother   . Other Father   . Lung cancer Father   . Other Sister    Social History:  History  Alcohol Use No     History  Drug Use No    History   Social History  . Marital Status: Married    Spouse Name: N/A  . Number of Children: N/A  . Years of Education: N/A   Social History Main Topics  . Smoking status: Never Smoker   . Smokeless tobacco: Not on file  . Alcohol Use: No  . Drug Use: No  . Sexual Activity: No   Other Topics Concern  . None   Social History Narrative   Additional History:    Sleep: Good  Appetite:  Good   Assessment:   Musculoskeletal: Strength & Muscle Tone: within normal limits Gait & Station:  normal Patient leans: N/A   Psychiatric Specialty Exam: Physical Exam  Review of Systems  Psychiatric/Behavioral: Positive for depression and suicidal ideas (Denies any intent or plan). Negative for hallucinations, memory loss and substance abuse. The patient is not nervous/anxious and does not have insomnia.     Blood pressure 103/82, pulse 82, temperature 98 F (36.7 C), temperature source Oral, resp. rate 18, height 6' (1.829 m), weight 131.543 kg (290 lb), SpO2 95 %.Body mass index is 39.32 kg/(m^2).  General Appearance: Well Groomed  Patent attorney::  Good  Speech:  Normal Rate  Volume:  Normal  Mood:  Better  Affect:  Appropriate  Thought Process:  Linear and Logical  Orientation:  Full (Time, Place, and Person)  Thought Content:  Negative  Suicidal Thoughts:  Yes.  without intent/plan  Homicidal Thoughts:  No  Memory:  Immediate;   Good Recent;   Good Remote;   Good  Judgement:  Fair  Insight:  Fair  Psychomotor Activity:  Negative  Concentration:  Good  Recall:  Good  Fund of Knowledge:Good  Language: Good  Akathisia:  Negative  Handed:  Right unknown   AIMS (if indicated):     Assets:  Communication Skills Desire for Improvement  ADL's:  Intact  Cognition: WNL  Sleep:  Number of Hours: 4.75     Current  Medications: Current Facility-Administered Medications  Medication Dose Route Frequency Provider Last Rate Last Dose  . acetaminophen (TYLENOL) tablet 650 mg  650 mg Oral Q6H PRN Audery Amel, MD      . alum & mag hydroxide-simeth (MAALOX/MYLANTA) 200-200-20 MG/5ML suspension 30 mL  30 mL Oral Q4H PRN Audery Amel, MD      . ARIPiprazole (ABILIFY) tablet 5 mg  5 mg Oral Daily Audery Amel, MD   5 mg at 05/28/15 0934  . FLUoxetine (PROZAC) capsule 40 mg  40 mg Oral Daily Audery Amel, MD   40 mg at 05/28/15 0934  . hydrocortisone cream 1 %   Topical BID Audery Amel, MD      . magnesium hydroxide (MILK OF MAGNESIA) suspension 30 mL  30 mL Oral Daily PRN  Audery Amel, MD      . traZODone (DESYREL) tablet 100 mg  100 mg Oral QHS PRN Brandy Hale, MD        Lab Results: No results found for this or any previous visit (from the past 48 hour(s)).  Physical Findings: AIMS: Facial and Oral Movements Muscles of Facial Expression: None, normal Lips and Perioral Area: None, normal Jaw: None, normal Tongue: None, normal,Extremity Movements Upper (arms, wrists, hands, fingers): None, normal Lower (legs, knees, ankles, toes): None, normal, Trunk Movements Neck, shoulders, hips: None, normal, Overall Severity Severity of abnormal movements (highest score from questions above): None, normal Incapacitation due to abnormal movements: None, normal Patient's awareness of abnormal movements (rate only patient's report): No Awareness, Dental Status Current problems with teeth and/or dentures?: No Does patient usually wear dentures?: No  CIWA:    COWS:     Treatment Plan Summary: Daily contact with patient to assess and evaluate symptoms and progress in treatment, Medication management and Plan Will address issues as below.  Jesse Pearson is a 44 year old male with a history of psychotic depression admitted for suicidal ideation with a plan in the context of marital problems.  1. Suicidal ideation. He still feels suicidal. He is able to contract for safety in the hospital. He is on 15 minutes checks.  2. Mood. He was continued on Prozac for depression and Abiliy or psychosis. Marland Kitchen 3.Insomnia. Trazodone as needed.  4. Disposition. He will be discharged to home. He will follow up with [facility].  Medical Decision Making:  Established Problem, Worsening (2)     Jesse Pearson 05/28/2015, 3:10 PM

## 2015-05-28 NOTE — Plan of Care (Signed)
Problem: Ineffective individual coping Goal: STG: Patient will remain free from self harm Outcome: Progressing Remains free of self harm. Has not had any attempts to harm self or others.

## 2015-05-29 NOTE — Plan of Care (Signed)
Problem: Ineffective individual coping Goal: LTG: Patient will report a decrease in negative feelings Outcome: Progressing Reported decrease in negative feelings. Safety maintained.

## 2015-05-29 NOTE — Progress Notes (Signed)
[facility] MD Progress Note  05/29/2015 2:11 PM Jesse Pearson  MRN:  [id number] Subjective:  Patient is known to this writer from a prior admission. He states that the same things occurred to cause him stress such as marital discord. He has been attending groups. He denies any physical complaints at this time. He denies any side effects from medications. Principal Problem: Depression, major, recurrent, severe with psychosis Diagnosis:   Patient Active Problem List   Diagnosis Date Noted  . 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 Suicidal ideation [R45.851] 04/11/2015  . Sleep apnea [G47.30] 04/11/2015   Total Time spent with patient: 15 minutes   Past Medical History:  Past Medical History  Diagnosis Date  . Sleep apnea   . Anxiety     Past Surgical History  Procedure Laterality Date  . Appendectomy     Family History:  Family History  Problem Relation Age of Onset  . Breast cancer Mother   . Dementia Mother   . Other Father   . Lung cancer Father   . Other Sister    Social History:  History  Alcohol Use No     History  Drug Use No    History   Social History  . Marital Status: Married    Spouse Name: N/A  . Number of Children: N/A  . Years of Education: N/A   Social History Main Topics  . Smoking status: Never Smoker   . Smokeless tobacco: Not on file  . Alcohol Use: No  . Drug Use: No  . Sexual Activity: No   Other Topics Concern  . None   Social History Narrative   Additional History:    Sleep: Good  Appetite:  Good   Assessment:   Musculoskeletal: Strength & Muscle Tone: within normal limits Gait & Station: normal Patient leans: N/A   Psychiatric Specialty Exam: Physical Exam  Review of Systems  Psychiatric/Behavioral: Positive for depression and suicidal ideas (Denies any intent or plan). Negative for hallucinations,  memory loss and substance abuse. The patient is not nervous/anxious and does not have insomnia.     Blood pressure 126/86, pulse 90, temperature 98.9 F (37.2 C), temperature source Oral, resp. rate 18, height 6' (1.829 m), weight 131.543 kg (290 lb), SpO2 95 %.Body mass index is 39.32 kg/(m^2).  General Appearance: Well Groomed  Patent attorney::  Good  Speech:  Normal Rate  Volume:  Normal  Mood:  Better  Affect:  Appropriate  Thought Process:  Linear and Logical  Orientation:  Full (Time, Place, and Person)  Thought Content:  Negative  Suicidal Thoughts:  Yes.  without intent/plan  Homicidal Thoughts:  No  Memory:  Immediate;   Good Recent;   Good Remote;   Good  Judgement:  Fair  Insight:  Fair  Psychomotor Activity:  Negative  Concentration:  Good  Recall:  Good  Fund of Knowledge:Good  Language: Good  Akathisia:  Negative  Handed:  Right unknown   AIMS (if indicated):     Assets:  Communication Skills Desire for Improvement  ADL's:  Intact  Cognition: WNL  Sleep:  Number of Hours: 4.45     Current Medications: Current Facility-Administered Medications  Medication Dose Route Frequency Provider Last Rate Last Dose  . acetaminophen (TYLENOL) tablet 650 mg  650 mg Oral Q6H PRN Audery Amel, MD      . alum & mag hydroxide-simeth (MAALOX/MYLANTA)  200-200-20 MG/5ML suspension 30 mL  30 mL Oral Q4H PRN Audery Amel, MD      . ARIPiprazole (ABILIFY) tablet 5 mg  5 mg Oral Daily Audery Amel, MD   5 mg at 05/29/15 0914  . FLUoxetine (PROZAC) capsule 40 mg  40 mg Oral Daily Audery Amel, MD   40 mg at 05/29/15 0914  . hydrocortisone cream 1 %   Topical BID Audery Amel, MD      . magnesium hydroxide (MILK OF MAGNESIA) suspension 30 mL  30 mL Oral Daily PRN Audery Amel, MD      . traZODone (DESYREL) tablet 100 mg  100 mg Oral QHS PRN Brandy Hale, MD        Lab Results: No results found for this or any previous visit (from the past 48 hour(s)).  Physical  Findings: AIMS: Facial and Oral Movements Muscles of Facial Expression: None, normal Lips and Perioral Area: None, normal Jaw: None, normal Tongue: None, normal,Extremity Movements Upper (arms, wrists, hands, fingers): None, normal Lower (legs, knees, ankles, toes): None, normal, Trunk Movements Neck, shoulders, hips: None, normal, Overall Severity Severity of abnormal movements (highest score from questions above): None, normal Incapacitation due to abnormal movements: None, normal Patient's awareness of abnormal movements (rate only patient's report): No Awareness, Dental Status Current problems with teeth and/or dentures?: No Does patient usually wear dentures?: No  CIWA:    COWS:     Treatment Plan Summary: Daily contact with patient to assess and evaluate symptoms and progress in treatment, Medication management and Plan Will address issues as below.  [name] is a 44 year old male with a history of psychotic depression admitted for suicidal ideation with a plan in the context of marital problems.  1. Suicidal ideation. He still feels suicidal. He is able to contract for safety in the hospital. He is on 15 minutes checks.  2. Mood. He was continued on Prozac for depression and Abilify for psychosis. Marland Kitchen 3.Insomnia. Trazodone as needed.  4. Disposition. He will be discharged to home. He will follow up with [facility].  Medical Decision Making:  Established Problem, Worsening (2)     Jesse Pearson 05/29/2015, 2:11 PM

## 2015-05-29 NOTE — Progress Notes (Signed)
Patient with depressed affect and cooperative behavior with meals, meds and plan of care. Fair adls, good appetite. Minimal interaction with peers. Quiet, appropriate when staff initiates interaction. Affect slightly brighter, good eye contact. Denies SI/HI at this time. Safety maintained.

## 2015-05-29 NOTE — [facility] Group Notes (Signed)
[facility] LCSW Group Therapy  05/29/2015 3:23 PM  Type of Therapy: Group Therapy  Participation Level: Minimal  Participation Quality: Attentive  Affect: Depressed  Cognitive: Alert  Insight: Limited  Engagement in Therapy: Limited  Modes of Intervention: Discussion, Education, Socialization and Support  Summary of Progress/Problems: Patients identify obstacles, self-sabotaging and enabling behaviors. Patients explore aspects of self sabotage and enabling and how to limit these self-destructive behaviors in everyday life. Pt attended group and stayed the entire time. He sat quietly and listened to other group members. He stated he is feeling much better today.   [employer] MSW, LCSWA  05/29/2015, 3:23 PM

## 2015-05-30 MED ORDER — FLUOXETINE HCL 40 MG PO CAPS: 40.0000 mg | ORAL_CAPSULE | Freq: Every day | ORAL | Status: AC

## 2015-05-30 MED ORDER — TRAZODONE HCL 100 MG PO TABS: 100.0000 mg | ORAL_TABLET | Freq: Every evening | ORAL | Status: AC | PRN

## 2015-05-30 NOTE — [facility] Suicide Risk Assessment (Signed)
[facility] Discharge Suicide Risk Assessment   Demographic Factors:  Male and Caucasian  Total Time spent with patient: 30 minutes  Musculoskeletal: Strength & Muscle Tone: within normal limits Gait & Station: normal Patient leans: N/A  Psychiatric Specialty Exam: Physical Exam  Nursing note and vitals reviewed.   Review of Systems  All other systems reviewed and are negative.   Blood pressure 134/88, pulse 99, temperature 97.9 F (36.6 C), temperature source Oral, resp. rate 20, height 6' (1.829 m), weight 131.543 kg (290 lb), SpO2 95 %.Body mass index is 39.32 kg/(m^2).  General Appearance: Casual  Eye Contact::  Good  Speech:  Clear and Coherent409  Volume:  Normal  Mood:  Euthymic  Affect:  Appropriate  Thought Process:  Goal Directed  Orientation:  Full (Time, Place, and Person)  Thought Content:  WDL  Suicidal Thoughts:  No  Homicidal Thoughts:  No  Memory:  Immediate;   Fair Recent;   Fair Remote;   Fair  Judgement:  Fair  Insight:  Fair  Psychomotor Activity:  Normal  Concentration:  Fair  Recall:  [employer] of Knowledge:Fair  Language: Fair  Akathisia:  No  Handed:  Right  AIMS (if indicated):     Assets:  Communication Skills Desire for Improvement Financial Resources/Insurance Housing Physical Health Social Support  Sleep:  Number of Hours: 7.15  Cognition: WNL  ADL's:  Intact   Have you used any form of tobacco in the last 30 days? (Cigarettes, Smokeless Tobacco, Cigars, and/or Pipes): No  Has this patient used any form of tobacco in the last 30 days? (Cigarettes, Smokeless Tobacco, Cigars, and/or Pipes) No  Mental Status Per Nursing Assessment::   On Admission:  NA  Current Mental Status by Physician: NA  Loss Factors: NA  Historical Factors: NA  Risk Reduction Factors:   Responsible for children under 54 years of age, Sense of responsibility to family, Living with another person, especially a relative, Positive social support and  Positive therapeutic relationship  Continued Clinical Symptoms:  Depression:   Severe  Cognitive Features That Contribute To Risk:  None    Suicide Risk:  Minimal: No identifiable suicidal ideation.  Patients presenting with no risk factors but with morbid ruminations; may be classified as minimal risk based on the severity of the depressive symptoms  Principal Problem: Depression, major, recurrent, severe with psychosis Discharge Diagnoses:  Patient Active Problem List   Diagnosis Date Noted  . 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 Suicidal ideation [R45.851] 04/11/2015  . Sleep apnea [G47.30] 04/11/2015    Follow-up Information    Follow up with [facility]. Go on 05/30/2015.   Why:  For follow-up care; Hospital followup appointment Monday 05/30/15 at 9:00am; walk in appointments are M-F 9am-4pm   Contact information:   [address] [place], [place] [phone] Fax [phone]      Plan Of Care/Follow-up recommendations:  Activity:  as tolerated. Diet:  low sodium heart healthy. Other:  keep follow up appointments.  Is patient on multiple antipsychotic therapies at discharge:  No   Has Patient had three or more failed trials of antipsychotic monotherapy by history:  No  Recommended Plan for Multiple Antipsychotic Therapies: NA    Boone [employer] 05/30/2015, 10:07 AM

## 2015-05-30 NOTE — Progress Notes (Signed)
D: Pt denies SI/HI/AVH. Pt is pleasant and cooperative. Pt stated he was feeling a lot better from the time he came in. Pt plans to have a meeting with his counselor when he D/C's . Pt continues to forward little information .   A: Pt was offered support and encouragement. Pt was given scheduled medications. Pt was encourage to attend groups. Q 15 minute checks were done for safety.   R:Pt attends groups and interacts well with peers and staff. Pt is taking medication. Pt has no complaints.Pt receptive to treatment and safety maintained on unit.

## 2015-05-30 NOTE — Progress Notes (Signed)
Patient discharged home with sister. Instructions reviewed. Patient verbalized understanding of meds and follow up care. Denies SI/HI/AVH. Belongings returned.

## 2015-05-30 NOTE — Plan of Care (Signed)
Problem: Ineffective individual coping Goal: LTG: Patient will report a decrease in negative feelings Outcome: Progressing Pt stated he was feeling much better since coming in the hospital Goal: STG: Patient will remain free from self harm Outcome: Progressing Pt safe on the unit  Problem: Alteration in mood Goal: LTG-Patient reports reduction in suicidal thoughts (Patient reports reduction in suicidal thoughts and is able to verbalize a safety plan for whenever patient is feeling suicidal)  Outcome: Progressing Pt denies SI at this time

## 2015-05-30 NOTE — Discharge Summary (Signed)
Physician Discharge Summary Note  Patient:  Jesse Pearson is an 44 y.o., male MRN:  [id number] DOB:  10-27-1971 Patient phone:  [phone] (home)  Patient address:   [address] [place] [address],  Total Time spent with patient: 30 minutes  Date of Admission:  05/26/2015 Date of Discharge: 05/30/2015  Reason for Admission:  Suicidal ideation.  Identyfying data. Jesse Pearson is a 44 year old male with a history of depression.  Chief complaint. "I did not feel safe."  History of present illness. The patient was hospitalized at [facility] one month ago for depression and suicidal ideation after his wife left him. He followed up with Dr. Thornell Pearson at [facility]. His prozac was increased to 40 mg for depression and he was conrinued on Abilify or psychotic features. He has been compliant with treatment and has not been using substances. He was doing well. However, ywo days ago he wasinormed that he no longer will enjoy short term disability. He still is on FMLA. He believes that it is in error. His employer/insurer requested a doctor's note and Dr. Mervyn Pearson. Was not able to provide one. The patient became depressed, distraught, suicidal and psychotic and came to the hosptal.  Past psychiatric history. One prior hospitalization. No suicide attempts.  Family psychiatric history. None.  Social history. Wife left several months ago. He lives with his 52 year old son. 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  Principal Problem: Depression, major, recurrent, severe with psychosis Discharge Diagnoses: Patient Active Problem List   Diagnosis Date Noted  . 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 Suicidal ideation [R45.851] 04/11/2015  . Sleep apnea [G47.30] 04/11/2015    Musculoskeletal: Strength &  Muscle Tone: within normal limits Gait & Station: normal Patient leans: N/A  Psychiatric Specialty Exam: Physical Exam  Nursing note and vitals reviewed.   Review of Systems  All other systems reviewed and are negative.   Blood pressure 134/88, pulse 99, temperature 97.9 F (36.6 C), temperature source Oral, resp. rate 20, height 6' (1.829 m), weight 131.543 kg (290 lb), SpO2 95 %.Body mass index is 39.32 kg/(m^2).  See SRA.                                                  Sleep:  Number of Hours: 7.15   Have you used any form of tobacco in the last 30 days? (Cigarettes, Smokeless Tobacco, Cigars, and/or Pipes): No  Has this patient used any form of tobacco in the last 30 days? (Cigarettes, Smokeless Tobacco, Cigars, and/or Pipes) No  Past Medical History:  Past Medical History  Diagnosis Date  . Sleep apnea   . Anxiety     Past Surgical History  Procedure Laterality Date  . Appendectomy     Family History:  Family History  Problem Relation Age of Onset  . Breast cancer Mother   . Dementia Mother   . Other Father   . Lung cancer Father   . Other Sister    Social History:  History  Alcohol Use No     History  Drug Use No    History   Social History  . Marital Status: Married    Spouse Name: N/A  .  Number of Children: N/A  . Years of Education: N/A   Social History Main Topics  . Smoking status: Never Smoker   . Smokeless tobacco: Not on file  . Alcohol Use: No  . Drug Use: No  . Sexual Activity: No   Other Topics Concern  . None   Social History Narrative    Past Psychiatric History: Hospitalizations:  Outpatient Care:  Substance Abuse Care:  Self-Mutilation:  Suicidal Attempts:  Violent Behaviors:   Risk to Self: Is patient at risk for suicide?: Yes Risk to Others:   Prior Inpatient Therapy:   Prior Outpatient Therapy:    Level of Care:  OP  Hospital Course:    Mr. Jesse Pearson is a 44 year old male with a history  of psychotic depression admitted for suicidal ideation with a plan in the context of marital problems.  1. Suicidal ideation. He still feels suicidal. He is able to contract for safety in the hospital. He is on 15 minutes checks.  2. Mood. He was continued on Prozac for depression and Abilify for psychosis. . 3. Insomnia. Trazodone as needed.  4. Disposition. He will be discharged to home. He will follow up with [facility].   Consults:  None  Significant Diagnostic Studies:  None  Discharge Vitals:   Blood pressure 134/88, pulse 99, temperature 97.9 F (36.6 C), temperature source Oral, resp. rate 20, height 6' (1.829 m), weight 131.543 kg (290 lb), SpO2 95 %. Body mass index is 39.32 kg/(m^2). Lab Results:   No results found for this or any previous visit (from the past 72 hour(s)).  Physical Findings: AIMS: Facial and Oral Movements Muscles of Facial Expression: None, normal Lips and Perioral Area: None, normal Jaw: None, normal Tongue: None, normal,Extremity Movements Upper (arms, wrists, hands, fingers): None, normal Lower (legs, knees, ankles, toes): None, normal, Trunk Movements Neck, shoulders, hips: None, normal, Overall Severity Severity of abnormal movements (highest score from questions above): None, normal Incapacitation due to abnormal movements: None, normal Patient's awareness of abnormal movements (rate only patient's report): No Awareness, Dental Status Current problems with teeth and/or dentures?: No Does patient usually wear dentures?: No  CIWA:    COWS:      See Psychiatric Specialty Exam and Suicide Risk Assessment completed by Attending Physician prior to discharge.  Discharge destination:  Home  Is patient on multiple antipsychotic therapies at discharge:  No   Has Patient had three or more failed trials of antipsychotic monotherapy by history:  No    Recommended Plan for Multiple Antipsychotic Therapies: NA  Discharge Instructions    Diet -  low sodium heart healthy    Complete by:  As directed      Increase activity slowly    Complete by:  As directed             Medication List    TAKE these medications      Indication   ARIPiprazole 5 MG tablet  Commonly known as:  ABILIFY  Take 1 tablet (5 mg total) by mouth daily.   Indication:  Major Depressive Disorder     FLUoxetine 40 MG capsule  Commonly known as:  PROZAC  Take 1 capsule (40 mg total) by mouth daily.   Indication:  Depression     fluticasone 50 MCG/ACT nasal spray  Commonly known as:  FLONASE  Place 1 spray into both nostrils 2 (two) times daily.      hydrocortisone cream 1 %  Apply topically 2 (two) times daily.  pantoprazole 40 MG tablet  Commonly known as:  PROTONIX  Take 1 tablet (40 mg total) by mouth daily.      traZODone 100 MG tablet  Commonly known as:  DESYREL  Take 1 tablet (100 mg total) by mouth at bedtime as needed for sleep.   Indication:  Trouble Sleeping           Follow-up Information    Follow up with [facility]. Go on 05/30/2015.   Why:  For follow-up care; Hospital followup appointment Monday 05/30/15 at 9:00am; walk in appointments are M-F 9am-4pm   Contact information:   [address] [place], [place] [phone] Fax [phone]      Follow-up recommendations:  Activity:  As tolerated. Diet:  Low sodium heart healthy. Other:  Keep follow-up appointments.  Comments:    Total Discharge Time: 35 min.  Signed: Lorea Kupfer 05/30/2015, 1:28 PM

## 2015-05-30 NOTE — [facility] Group Notes (Signed)
[facility] Group Notes:  (Nursing/MHT/Case Management/Adjunct)  Date:  05/30/2015  Time:  1:46 PM  Type of Therapy:  Psychoeducational Skills  Participation Level:  Did Not Attend  Summary of Progress/Problems:  Foy Guadalajara 05/30/2015, 1:46 PM

## 2015-05-31 NOTE — Progress Notes (Signed)
AVS H&P Discharge Summary faxed to [facility] for hospital follow-up °

## 2015-06-23 ENCOUNTER — Ambulatory Visit: Payer: PRIVATE HEALTH INSURANCE | Admitting: Psychology

## 2016-05-03 ENCOUNTER — Encounter: Payer: Self-pay | Admitting: Emergency Medicine

## 2016-05-03 ENCOUNTER — Emergency Department
Admission: EM | Admit: 2016-05-03 | Discharge: 2016-05-03 | Disposition: A | Discharge status: Home / Self Care | Payer: No Typology Code available for payment source | Attending: Emergency Medicine | Admitting: Emergency Medicine

## 2016-05-03 ENCOUNTER — Emergency Department: Payer: No Typology Code available for payment source

## 2016-05-03 DIAGNOSIS — Z79899 Other long term (current) drug therapy: Secondary | ICD-10-CM | POA: Insufficient documentation

## 2016-05-03 DIAGNOSIS — W010XXA Fall on same level from slipping, tripping and stumbling without subsequent striking against object, initial encounter: Secondary | ICD-10-CM | POA: Diagnosis not present

## 2016-05-03 DIAGNOSIS — Y999 Unspecified external cause status: Secondary | ICD-10-CM | POA: Insufficient documentation

## 2016-05-03 DIAGNOSIS — Y929 Unspecified place or not applicable: Secondary | ICD-10-CM | POA: Diagnosis not present

## 2016-05-03 DIAGNOSIS — S5292XA Unspecified fracture of left forearm, initial encounter for closed fracture: Secondary | ICD-10-CM

## 2016-05-03 DIAGNOSIS — Z7951 Long term (current) use of inhaled steroids: Secondary | ICD-10-CM | POA: Diagnosis not present

## 2016-05-03 DIAGNOSIS — W109XXA Fall (on) (from) unspecified stairs and steps, initial encounter: Secondary | ICD-10-CM | POA: Diagnosis not present

## 2016-05-03 DIAGNOSIS — S59912A Unspecified injury of left forearm, initial encounter: Secondary | ICD-10-CM | POA: Diagnosis present

## 2016-05-03 DIAGNOSIS — S52502A Unspecified fracture of the lower end of left radius, initial encounter for closed fracture: Secondary | ICD-10-CM | POA: Insufficient documentation

## 2016-05-03 DIAGNOSIS — F333 Major depressive disorder, recurrent, severe with psychotic symptoms: Secondary | ICD-10-CM | POA: Insufficient documentation

## 2016-05-03 DIAGNOSIS — Y939 Activity, unspecified: Secondary | ICD-10-CM | POA: Diagnosis not present

## 2016-05-03 MED ORDER — HYDROCODONE-ACETAMINOPHEN 5-325 MG PO TABS: 1.0000 | ORAL_TABLET | ORAL | Status: AC | PRN

## 2016-05-03 NOTE — ED Provider Notes (Signed)
[facility] Emergency Department Provider Note  ____________________________________________  Time seen: Approximately 10:02 AM  I have reviewed the triage vital signs and the nursing notes.   HISTORY  Chief Complaint Wrist Pain    HPI Jesse Pearson is a 45 y.o. male presents for evaluation of left wrist pain after falling last night on steps in the rain. Complains of continuous pain. Rates his pain as a 10 over 10 at this time. No relief with over-the-counter ibuprofen.   Past Medical History  Diagnosis Date  . Sleep apnea   . Anxiety     Patient Active Problem List   Diagnosis Date Noted  . Depression, major, recurrent, severe with psychosis (HCC) 05/26/2015  . Severe major depression with psychotic features, mood-congruent (HCC) 04/11/2015  . Suicidal ideation 04/11/2015  . Sleep apnea 04/11/2015    Past Surgical History  Procedure Laterality Date  . Appendectomy      Current Outpatient Rx  Name  Route  Sig  Dispense  Refill  . ARIPiprazole (ABILIFY) 5 MG tablet   Oral   Take 1 tablet (5 mg total) by mouth daily.   30 tablet   0   . FLUoxetine (PROZAC) 40 MG capsule   Oral   Take 1 capsule (40 mg total) by mouth daily.   30 capsule   0   . fluticasone (FLONASE) 50 MCG/ACT nasal spray   Each Nare   Place 1 spray into both nostrils 2 (two) times daily.   16 g   0   . HYDROcodone-acetaminophen (NORCO) 5-325 MG tablet   Oral   Take 1-2 tablets by mouth every 4 (four) hours as needed for moderate pain.   15 tablet   0   . hydrocortisone cream 1 %   Topical   Apply topically 2 (two) times daily.   1.5 g   0   . traZODone (DESYREL) 100 MG tablet   Oral   Take 1 tablet (100 mg total) by mouth at bedtime as needed for sleep.   30 tablet   0     Allergies Cephalosporins and Clindamycin/lincomycin  Family History  Problem Relation Age of Onset  . Breast cancer Mother   . Dementia Mother   . Other Father   .  Lung cancer Father   . Other Sister     Social History Social History  Substance Use Topics  . Smoking status: Never Smoker   . Smokeless tobacco: None  . Alcohol Use: No    Review of Systems Constitutional: No fever/chills Cardiovascular: Denies chest pain. Respiratory: Denies shortness of breath. Musculoskeletal: Positive for left wrist pain. Skin: Negative for rash. Neurological: Negative for headaches, focal weakness or numbness.  10-point ROS otherwise negative.  ____________________________________________   PHYSICAL EXAM: BP 138/70 mmHg  Pulse 70  Temp(Src) 98 F (36.7 C) (Oral)  Resp 20  Ht 6' (1.829 m)  Wt 154.223 kg  BMI 46.10 kg/m2  SpO2 98%  VITAL SIGNS: ED Triage Vitals  Enc Vitals Group     BP --      Pulse --      Resp --      Temp --      Temp src --      SpO2 --      Weight 05/03/16 0956 340 lb (154.223 kg)     Height 05/03/16 0956 6' (1.829 m)     Head Cir --      Peak Flow --  Pain Score 05/03/16 0957 10     Pain Loc --      Pain Edu? --      Excl. in GC? --     Constitutional: Alert and oriented. Well appearing and in no acute distress.  Cardiovascular: Normal rate, regular rhythm. Grossly normal heart sounds.  Good peripheral circulation. Respiratory: Normal respiratory effort.  No retractions. Lungs CTAB. Musculoskeletal: Left wrist with limited range of motion. Increased pain with pronation supination. Distally neurovascularly intact. Radial pulses good. Neurologic:  Normal speech and language. No gross focal neurologic deficits are appreciated. No gait instability. Skin:  Skin is warm, dry and intact. No rash noted. Psychiatric: Mood and affect are normal. Speech and behavior are normal.  ____________________________________________   LABS (all labs ordered are listed, but only abnormal results are displayed)  Labs Reviewed - No data to  display ____________________________________________  EKG   ____________________________________________  RADIOLOGY  FINDINGS: Frontal, oblique, lateral, and ulnar deviation scaphoid images were obtained. There is an obliquely oriented fracture of the distal radius extending from the metaphysis medially to the level of the joint space more laterally. Alignment at this fracture site is anatomic. No other fracture. No dislocation. The joint spaces appear normal. No erosive change.  IMPRESSION: Obliquely oriented fracture distal radius, nondisplaced. No other fracture. No dislocation. No apparent arthropathy. ____________________________________________   PROCEDURES  Procedure(s) performed: None  Critical Care performed: No  ____________________________________________   INITIAL IMPRESSION / ASSESSMENT AND PLAN / ED COURSE  Pertinent labs & imaging results that were available during my care of the patient were reviewed by me and considered in my medical decision making (see chart for details).  Oblique fracture distal radius nondisplaced. Rx given for Vicodin 5/325. Patient to follow-up with Dr. Lutricia HorsfallHooton who is orthopedics on-call. [place] CRS was reviewed prior to prescribing any medications. ____________________________________________   FINAL CLINICAL IMPRESSION(S) / ED DIAGNOSES  Final diagnoses:  Radial fracture, left, closed, initial encounter     This chart was dictated using voice recognition software/Dragon. Despite best efforts to proofread, errors can occur which can change the meaning. Any change was purely unintentional.   Evangeline Dakinharles M Beers, PA-C 05/03/16 1040  Nita Sicklearolina Veronese, MD 05/03/16 [phone]

## 2016-05-03 NOTE — ED Notes (Signed)
Patient presents to the ED with left wrist pain.  Patient states he tripped down a few steps last night in the rain and fell on his left wrist.  Patient states, "I think I might have broken it."  Patient is in no obvious distress at this time.  Wrist does not have an obvious deformity.

## 2016-05-03 NOTE — Discharge Instructions (Signed)
Forearm Fracture °A forearm fracture is a break in one or both of the bones of your arm that are between the elbow and the wrist. Your forearm is made up of two bones: °· Radius. This is the bone on the inside of your arm near your thumb. °· Ulna. This is the bone on the outside of your arm near your little finger. °Middle forearm fractures usually break both the radius and the ulna. Most forearm fractures that involve both the ulna and radius will require surgery. °CAUSES °Common causes of this type of fracture include: °· Falling on an outstretched arm. °· Accidents, such as a car or bike accident. °· A hard, direct hit to the middle part of your arm. °RISK FACTORS °You may be at higher risk for this type of fracture if: °· You play contact sports. °· You have a condition that causes your bones to be weak or thin (osteoporosis). °SIGNS AND SYMPTOMS °A forearm fracture causes pain immediately after the injury. Other signs and symptoms include: °· An abnormal bend or bump in your arm (deformity). °· Swelling. °· Numbness or tingling. °· Tenderness. °· Inability to turn your hand from side to side (rotate). °· Bruising. °DIAGNOSIS °Your health care provider may diagnose a forearm fracture based on: °· Your symptoms. °· Your medical history, including any recent injury. °· A physical exam. Your health care provider will look for any deformity and feel for tenderness over the break. Your health care provider will also check whether the bones are out of place. °· An X-ray exam to confirm the diagnosis and learn more about the type of fracture. °TREATMENT °The goals of treatment are to get the bone or bones in proper position for healing and to keep the bones from moving so they will heal over time. Your treatment will depend on many factors, especially the type of fracture that you have. °· If the fractured bone or bones: °¨ Are in the correct position (nondisplaced), you may only need to wear a cast or a  splint. °¨ Have a slightly displaced fracture, you may need to have the bones moved back into place manually (closed reduction) before the splint or cast is put on. °· You may have a temporary splint before you have a cast. The splint allows room for some swelling. After a few days, a cast can replace the splint. °· You may have to wear the cast for 6-8 weeks or as directed by your health care provider. °· The cast may be changed after about 3 weeks or as directed by your health care provider. °· After your cast is removed, you may need physical therapy to regain full movement in your wrist or elbow. °· You may need emergency surgery if you have: °¨ A fractured bone or bones that are out of position (displaced). °¨ A fracture with multiple fragments (comminuted fracture). °¨ A fracture that breaks the skin (open fracture). This type of fracture may require surgical wires, plates, or screws to hold the bone or bones in place. °· You may have X-rays every couple of weeks to check on your healing. °HOME CARE INSTRUCTIONS °If You Have a Cast: °· Do not stick anything inside the cast to scratch your skin. Doing that increases your risk of infection. °· Check the skin around the cast every day. Report any concerns to your health care provider. You may put lotion on dry skin around the edges of the cast. Do not apply lotion to the skin   underneath the cast. °If You Have a Splint: °· Wear it as directed by your health care provider. Remove it only as directed by your health care provider. °· Loosen the splint if your fingers become numb and tingle, or if they turn cold and blue. °Bathing °· Cover the cast or splint with a watertight plastic bag to protect it from water while you bathe or shower. Do not let the cast or splint get wet. °Managing Pain, Stiffness, and Swelling °· If directed, apply ice to the injured area: °¨ Put ice in a plastic bag. °¨ Place a towel between your skin and the bag. °¨ Leave the ice on for 20  minutes, 2-3 times a day. °· Move your fingers often to avoid stiffness and to lessen swelling. °· Raise the injured area above the level of your heart while you are sitting or lying down. °Driving °· Do not drive or operate heavy machinery while taking pain medicine. °· Do not drive while wearing a cast or splint on a hand that you use for driving. °Activity °· Return to your normal activities as directed by your health care provider. Ask your health care provider what activities are safe for you. °· Perform range-of-motion exercises only as directed by your health care provider. °Safety °· Do not use your injured limb to support your body weight until your health care provider says that you can. °General Instructions °· Do not put pressure on any part of the cast or splint until it is fully hardened. This may take several hours. °· Keep the cast or splint clean and dry. °· Do not use any tobacco products, including cigarettes, chewing tobacco, or electronic cigarettes. Tobacco can delay bone healing. If you need help quitting, ask your health care provider. °· Take medicines only as directed by your health care provider. °· Keep all follow-up visits as directed by your health care provider. This is important. °SEEK MEDICAL CARE IF: °· Your pain medicine is not helping. °· Your cast or splint becomes wet or damaged or suddenly feels too tight. °· Your cast becomes loose. °· You have more severe pain or swelling than you did before the cast. °· You have severe pain when you stretch your fingers. °· You continue to have pain or stiffness in your elbow or your wrist after your cast is removed. °SEEK IMMEDIATE MEDICAL CARE IF: °· You cannot move your fingers. °· You lose feeling in your fingers or your hand. °· Your hand or your fingers turn cold and pale or blue. °· You notice a bad smell coming from your cast. °· You have drainage from underneath your cast. °· You have new stains from blood or drainage that is coming  through your cast. °  °This information is not intended to replace advice given to you by your health care provider. Make sure you discuss any questions you have with your health care provider. °  °Document Released: 10/12/2000 Document Revised: 11/05/2014 Document Reviewed: 05/31/2014 °[employer]vier Interactive Patient Education ©2016 [employer] ° °

## 2017-05-29 DEATH — deceased
